# Patient Record
Sex: Male | Born: 1995
Health system: Southern US, Community
[De-identification: ages and names within clinical notes are randomized; demographics above are authoritative.]

## PROBLEM LIST (undated history)

## (undated) DIAGNOSIS — T7840XA Allergy, unspecified, initial encounter: Secondary | ICD-10-CM

## (undated) DIAGNOSIS — J302 Other seasonal allergic rhinitis: Secondary | ICD-10-CM

## (undated) DIAGNOSIS — L709 Acne, unspecified: Secondary | ICD-10-CM

## (undated) DIAGNOSIS — S060XAA Concussion with loss of consciousness status unknown, initial encounter: Secondary | ICD-10-CM

## (undated) DIAGNOSIS — S060X9A Concussion with loss of consciousness of unspecified duration, initial encounter: Secondary | ICD-10-CM

## (undated) HISTORY — DX: Acne, unspecified: L70.9

## (undated) HISTORY — DX: Allergy, unspecified, initial encounter: T78.40XA

---
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
## 2017-04-30 DIAGNOSIS — R0981 Nasal congestion: Secondary | ICD-10-CM | POA: Diagnosis not present

## 2017-05-24 DIAGNOSIS — Z23 Encounter for immunization: Secondary | ICD-10-CM | POA: Diagnosis not present

## 2017-07-20 MED FILL — PREVIDENT 5000 BOOSTER PLUS: 1.1 | 30 days supply | Qty: 100 | Fill #0

## 2017-10-07 DIAGNOSIS — Z0000 Encounter for general adult medical examination without abnormal findings: Secondary | ICD-10-CM | POA: Diagnosis not present

## 2018-02-16 MED FILL — PREVIDENT 5000 BOOSTER PLUS: 1.1 | 30 days supply | Qty: 100 | Fill #1

## 2018-10-28 ENCOUNTER — Emergency Department (HOSPITAL_COMMUNITY): Payer: 59

## 2018-10-28 ENCOUNTER — Inpatient Hospital Stay (HOSPITAL_COMMUNITY)
Admission: EM | Admit: 2018-10-28 | Discharge: 2018-11-03 | DRG: 084 | Disposition: A | Discharge status: Home / Self Care | Payer: 59 | Source: Ambulatory Visit | Attending: Neurosurgery | Admitting: Neurosurgery

## 2018-10-28 ENCOUNTER — Other Ambulatory Visit: Payer: Self-pay

## 2018-10-28 DIAGNOSIS — S02119A Unspecified fracture of occiput, initial encounter for closed fracture: Secondary | ICD-10-CM | POA: Diagnosis present

## 2018-10-28 DIAGNOSIS — I609 Nontraumatic subarachnoid hemorrhage, unspecified: Secondary | ICD-10-CM

## 2018-10-28 DIAGNOSIS — R001 Bradycardia, unspecified: Secondary | ICD-10-CM | POA: Diagnosis present

## 2018-10-28 DIAGNOSIS — F458 Other somatoform disorders: Secondary | ICD-10-CM | POA: Diagnosis present

## 2018-10-28 DIAGNOSIS — S0990XA Unspecified injury of head, initial encounter: Secondary | ICD-10-CM | POA: Diagnosis not present

## 2018-10-28 DIAGNOSIS — Z23 Encounter for immunization: Secondary | ICD-10-CM | POA: Diagnosis not present

## 2018-10-28 DIAGNOSIS — Z833 Family history of diabetes mellitus: Secondary | ICD-10-CM | POA: Diagnosis not present

## 2018-10-28 DIAGNOSIS — S0291XA Unspecified fracture of skull, initial encounter for closed fracture: Secondary | ICD-10-CM | POA: Diagnosis not present

## 2018-10-28 DIAGNOSIS — R531 Weakness: Secondary | ICD-10-CM | POA: Diagnosis not present

## 2018-10-28 DIAGNOSIS — R456 Violent behavior: Secondary | ICD-10-CM | POA: Diagnosis not present

## 2018-10-28 DIAGNOSIS — Z825 Family history of asthma and other chronic lower respiratory diseases: Secondary | ICD-10-CM

## 2018-10-28 DIAGNOSIS — R55 Syncope and collapse: Secondary | ICD-10-CM | POA: Diagnosis present

## 2018-10-28 DIAGNOSIS — Y92531 Health care provider office as the place of occurrence of the external cause: Secondary | ICD-10-CM

## 2018-10-28 DIAGNOSIS — R402363 Coma scale, best motor response, obeys commands, at hospital admission: Secondary | ICD-10-CM | POA: Diagnosis present

## 2018-10-28 DIAGNOSIS — I951 Orthostatic hypotension: Secondary | ICD-10-CM | POA: Diagnosis not present

## 2018-10-28 DIAGNOSIS — R402143 Coma scale, eyes open, spontaneous, at hospital admission: Secondary | ICD-10-CM | POA: Diagnosis present

## 2018-10-28 DIAGNOSIS — R4587 Impulsiveness: Secondary | ICD-10-CM | POA: Diagnosis present

## 2018-10-28 DIAGNOSIS — W19XXXA Unspecified fall, initial encounter: Secondary | ICD-10-CM | POA: Diagnosis present

## 2018-10-28 DIAGNOSIS — W228XXA Striking against or struck by other objects, initial encounter: Secondary | ICD-10-CM | POA: Diagnosis present

## 2018-10-28 DIAGNOSIS — S06339A Contusion and laceration of cerebrum, unspecified, with loss of consciousness of unspecified duration, initial encounter: Secondary | ICD-10-CM | POA: Diagnosis not present

## 2018-10-28 DIAGNOSIS — I629 Nontraumatic intracranial hemorrhage, unspecified: Secondary | ICD-10-CM | POA: Diagnosis not present

## 2018-10-28 DIAGNOSIS — Z79899 Other long term (current) drug therapy: Secondary | ICD-10-CM | POA: Diagnosis not present

## 2018-10-28 DIAGNOSIS — R402233 Coma scale, best verbal response, inappropriate words, at hospital admission: Secondary | ICD-10-CM | POA: Diagnosis present

## 2018-10-28 DIAGNOSIS — S06329A Contusion and laceration of left cerebrum with loss of consciousness of unspecified duration, initial encounter: Secondary | ICD-10-CM | POA: Diagnosis present

## 2018-10-28 DIAGNOSIS — R404 Transient alteration of awareness: Secondary | ICD-10-CM | POA: Diagnosis not present

## 2018-10-28 DIAGNOSIS — E860 Dehydration: Secondary | ICD-10-CM | POA: Diagnosis present

## 2018-10-28 DIAGNOSIS — D72829 Elevated white blood cell count, unspecified: Secondary | ICD-10-CM | POA: Diagnosis not present

## 2018-10-28 DIAGNOSIS — S06360A Traumatic hemorrhage of cerebrum, unspecified, without loss of consciousness, initial encounter: Secondary | ICD-10-CM | POA: Diagnosis not present

## 2018-10-28 LAB — BASIC METABOLIC PANEL
ANION GAP: 10 (ref 5–15)
BUN: 18 mg/dL (ref 6–20)
CALCIUM: 9.2 mg/dL (ref 8.9–10.3)
CO2: 24 mmol/L (ref 22–32)
Chloride: 103 mmol/L (ref 98–111)
Creatinine, Ser: 1.03 mg/dL (ref 0.61–1.24)
GFR calc Af Amer: >60 mL/min (ref >60)
GFR calc non Af Amer: >60 mL/min (ref >60)
GLUCOSE: 155 mg/dL — AB (ref 70–99)
Potassium: 4.2 mmol/L (ref 3.5–5.1)
Sodium: 137 mmol/L (ref 135–145)

## 2018-10-28 LAB — CBC WITH DIFFERENTIAL/PLATELET
Abs Immature Granulocytes: 0.07 10*3/uL (ref 0.00–0.07)
Basophils Absolute: 0.1 10*3/uL (ref 0.0–0.1)
Basophils Relative: 0 %
EOS ABS: 0.1 10*3/uL (ref 0.0–0.5)
EOS PCT: 0 %
HEMATOCRIT: 42.1 % (ref 39.0–52.0)
Hemoglobin: 14.4 g/dL (ref 13.0–17.0)
IMMATURE GRANULOCYTES: 0 %
LYMPHS ABS: 1.2 10*3/uL (ref 0.7–4.0)
Lymphocytes Relative: 7 %
MCH: 29 pg (ref 26.0–34.0)
MCHC: 34.2 g/dL (ref 30.0–36.0)
MCV: 84.9 fL (ref 80.0–100.0)
MONO ABS: 1.2 10*3/uL — AB (ref 0.1–1.0)
MONOS PCT: 7 %
Neutro Abs: 14.6 10*3/uL — ABNORMAL HIGH (ref 1.7–7.7)
Neutrophils Relative %: 86 %
Platelets: 251 10*3/uL (ref 150–400)
RBC: 4.96 MIL/uL (ref 4.22–5.81)
RDW: 11.9 % (ref 11.5–15.5)
WBC: 17.2 10*3/uL — ABNORMAL HIGH (ref 4.0–10.5)
nRBC: 0 % (ref 0.0–0.2)

## 2018-10-28 LAB — CBG MONITORING, ED: Glucose-Capillary: 128 mg/dL — ABNORMAL HIGH (ref 70–99)

## 2018-10-28 LAB — ETHANOL

## 2018-10-28 LAB — RAPID URINE DRUG SCREEN, HOSP PERFORMED
Amphetamines: NOT DETECTED
BARBITURATES: NOT DETECTED
BENZODIAZEPINES: NOT DETECTED
Cocaine: NOT DETECTED
Opiates: NOT DETECTED
Tetrahydrocannabinol: NOT DETECTED

## 2018-10-28 MED ORDER — SODIUM CHLORIDE 0.9 % IV SOLN
INTRAVENOUS | Status: DC | PRN
  Administered 2018-10-28: 250 mL via INTRAVENOUS

## 2018-10-28 MED ORDER — POTASSIUM CHLORIDE IN NACL 20-0.9 MEQ/L-% IV SOLN
INTRAVENOUS | Status: DC
  Administered 2018-10-28: 15:00:00 via INTRAVENOUS
  Filled 2018-10-28 (×2): qty 1000

## 2018-10-28 MED ORDER — ACETAMINOPHEN 325 MG PO TABS
650.0000 mg | ORAL_TABLET | ORAL | Status: DC | PRN
  Administered 2018-10-28 – 2018-10-29 (×2): 650 mg via ORAL
  Administered 2018-10-29: 325 mg via ORAL
  Administered 2018-10-29 – 2018-11-03 (×5): 650 mg via ORAL
  Filled 2018-10-28 (×8): qty 2

## 2018-10-28 MED ORDER — FAMOTIDINE IN NACL 20-0.9 MG/50ML-% IV SOLN
20.0000 mg | Freq: Two times a day (BID) | INTRAVENOUS | Status: DC
  Administered 2018-10-28 – 2018-10-30 (×5): 20 mg via INTRAVENOUS
  Filled 2018-10-28 (×5): qty 50

## 2018-10-28 MED ORDER — HYDROCODONE-ACETAMINOPHEN 5-325 MG PO TABS
1.0000 | ORAL_TABLET | ORAL | Status: DC | PRN
  Administered 2018-10-28 (×2): 1 via ORAL
  Administered 2018-10-29 (×2): 2 via ORAL
  Administered 2018-10-30 – 2018-10-31 (×4): 1 via ORAL
  Administered 2018-11-01: 2 via ORAL
  Administered 2018-11-01: 1 via ORAL
  Administered 2018-11-01 – 2018-11-02 (×4): 2 via ORAL
  Filled 2018-10-28 (×3): qty 2
  Filled 2018-10-28: qty 1
  Filled 2018-10-28: qty 2
  Filled 2018-10-28: qty 1
  Filled 2018-10-28 (×3): qty 2
  Filled 2018-10-28: qty 1
  Filled 2018-10-28: qty 2
  Filled 2018-10-28: qty 1
  Filled 2018-10-28: qty 2
  Filled 2018-10-28: qty 1
  Filled 2018-10-28: qty 2

## 2018-10-28 MED ORDER — ONDANSETRON HCL 4 MG/2ML IJ SOLN
4.0000 mg | Freq: Four times a day (QID) | INTRAMUSCULAR | Status: DC | PRN
  Administered 2018-10-28 – 2018-10-30 (×6): 4 mg via INTRAVENOUS
  Filled 2018-10-28 (×7): qty 2

## 2018-10-28 MED ORDER — ONDANSETRON HCL 4 MG/2ML IJ SOLN
4.0000 mg | Freq: Once | INTRAMUSCULAR | Status: AC
  Administered 2018-10-28: 4 mg via INTRAVENOUS

## 2018-10-28 NOTE — ED Notes (Signed)
Patient transported to CT 

## 2018-10-28 NOTE — H&P (Signed)
Subjective: The patient is a 23 year old white male who by report has had syncopal events with needles/blood, etc. in the past.  He has some injections and a primary doctor's office today and had a syncopal event falling and striking his head.  There was a brief loss of consciousness and mental status changes.  He was brought to [facility], ER where head scan was obtained.  This demonstrated a skull fracture and a cerebral contusion.  A neurosurgical consultation was requested.  Presently the patient is accompanied by his father provides his history.  There has been no reported seizures.  The patient denies neck pain.  He is not on any anticoagulants he is previously been healthy.  He is a Ship broker at [employer].  Past Medical History:  Diagnosis Date  . Acne   . Allergy    seasonal allergies  . Concussion   . Seasonal allergies     Past Surgical History:  Procedure Laterality Date  . NASAL SEPTOPLASTY W/ TURBINOPLASTY Bilateral 05/05/2016   Procedure: NASAL SEPTOPLASTY WITH TURBINATE REDUCTION;  Surgeon: Rozetta Nunnery, MD;  Location: [place];  Service: ENT;  Laterality: Bilateral;    No Known Allergies  Social History   Tobacco Use  . Smoking status: Never Smoker  . Smokeless tobacco: Never Used  Substance Use Topics  . Alcohol use: No    Family History  Problem Relation Age of Onset  . Diabetes Father   . COPD Paternal Grandmother    Prior to Admission medications   Medication Sig Start Date End Date Taking? Authorizing Provider  HYDROcodone-acetaminophen (NORCO/VICODIN) 5-325 MG tablet Take 1-2 tablets by mouth every 6 (six) hours as needed for moderate pain. 05/05/16   Rozetta Nunnery, MD     Review of Systems  Positive ROS: As above  All other systems have been reviewed and were otherwise negative with the exception of those mentioned in the HPI and as above.  Objective: Vital signs in last 24 hours: Pulse Rate:  [56-89] 81 (01/17  1245) Resp:  [0-22] 17 (01/17 1230) BP: (111-143)/(49-88) 126/84 (01/17 1245) SpO2:  [98 %-100 %] 100 % (01/17 1245) Estimated body mass index is 21.62 kg/m as calculated from the following:   Height as of 05/05/16: 5\' 11"  (1.803 m).   Weight as of 05/05/16: 70.3 kg.   Physical exam:  General: Mildly somnolent but easily arousable 23 year old white male who will answer simple questions.  HEENT: Patient has some occipital soft tissue swelling.  His pupils are equal.  Extractor muscles are largely intact although occasionally has a bit of disconjugate gaze.  I do not see any evidence of CSF otorrhea or rhinorrhea.  Neck: Supple without masses or deformities.  He has a normal range of motion.  Thorax: Symmetric  Abdomen: Soft  Extremities: Unremarkable  Neurologic exam: The patient is Glascow coma scale 13, E4M6V3.  He will answer simple questions.  He is oriented only to person.  He occasionally uses incorrect words.  The patient's motor strength is 5/5 in his bilateral bicep, handgrip, gastrocnemius, and dorsiflexors.  Cerebellar function is intact to rapid alternating movements of the upper extremities bilaterally.  Sensory function is intact to light touch sensation all tested dermatomes bilaterally.  Imaging studies: I have reviewed the patient's head CT performed today at [facility].  He has a midline occipital skull fracture.  He has a left frontal small cerebral contusion without significant mass-effect.  I have also reviewed his cervical CT.  It is unremarkable.   Data Review Lab Results  Component Value Date   WBC 17.2 (H) 10/28/2018   HGB 14.4 10/28/2018   HCT 42.1 10/28/2018   MCV 84.9 10/28/2018   PLT 251 10/28/2018   No results found for: NA, K, CL, CO2, BUN, CREATININE, GLUCOSE No results found for: INR, PROTIME  Assessment/Plan: Skull fracture, cerebral contusion, traumatic brain injury: I have discussed the situation with the patient's father.  I  recommend we admit him for observation and plan to repeat his CAT scan tomorrow to rule out additional bleeding.  He is agreeable.  I have answered all his questions.   Ophelia Charter 10/28/2018 1:27 PM

## 2018-10-28 NOTE — ED Notes (Signed)
Neuro surgery at bedside.

## 2018-10-28 NOTE — ED Notes (Signed)
Aspen collar placed.

## 2018-10-28 NOTE — ED Triage Notes (Addendum)
Pt got 2 vaccines at [employer]. Syncopized at desk. Hit chin on counter then head on floor. Was combative initially and vomiting and concussed. Repetitive questioning. Hx of syncopizing when had laceration. Pt is pale currently but alert to self and that surroundings but not time

## 2018-10-28 NOTE — ED Provider Notes (Signed)
[place] EMERGENCY DEPARTMENT Provider Note   CSN: [id number] Arrival date & time: 10/28/18  7948     History   Chief Complaint Chief Complaint  Patient presents with  . Loss of Consciousness    HPI Patrick Mclaughlin is a 23 y.o. male.  HPI  The patient is a 23 year old male who presents from the doctor's office after receiving vaccinations.  He has a known history of recurrent syncope after painful episodes or after seeing blood in the past.  Evidently today he had had vaccinations, went out to check out at the desk where he syncopized striking his chin on the counter and falling backwards striking his head.  He was reportedly combative and ill at the scene but has slowly improved.  He is asking repetitive questions and seems to be altered.  His mother is here reporting the additional history.  Level 5 caveat applies secondary to the patient's altered mental status.  Past Medical History:  Diagnosis Date  . Acne   . Allergy    seasonal allergies  . Concussion   . Seasonal allergies     There are no active problems to display for this patient.   Past Surgical History:  Procedure Laterality Date  . NASAL SEPTOPLASTY W/ TURBINOPLASTY Bilateral 05/05/2016   Procedure: NASAL SEPTOPLASTY WITH TURBINATE REDUCTION;  Surgeon: Rozetta Nunnery, MD;  Location: [place];  Service: ENT;  Laterality: Bilateral;        Home Medications    Prior to Admission medications   Medication Sig Start Date End Date Taking? Authorizing Provider  HYDROcodone-acetaminophen (NORCO/VICODIN) 5-325 MG tablet Take 1-2 tablets by mouth every 6 (six) hours as needed for moderate pain. 05/05/16   Rozetta Nunnery, MD    Family History Family History  Problem Relation Age of Onset  . Diabetes Father   . COPD Paternal Grandmother     Social History Social History   Tobacco Use  . Smoking status: Never Smoker  . Smokeless tobacco: Never Used    Substance Use Topics  . Alcohol use: No  . Drug use: No     Allergies   Patient has no known allergies.   Review of Systems Review of Systems  Unable to perform ROS: Mental status change     Physical Exam Updated Vital Signs BP 126/84   Pulse 81   Resp 17   SpO2 100%   Physical Exam Vitals signs and nursing note reviewed.  Constitutional:      General: He is not in acute distress.    Appearance: He is well-developed.     Comments: Somnolent, confused  HENT:     Head: Normocephalic.     Comments: Small hematoma to the back of the head, no laceration, lower lip on the right with small internal laceration but no through and through injury, dentition intact without tenderness or subluxation.  No malocclusion    Mouth/Throat:     Pharynx: No oropharyngeal exudate.  Eyes:     General: No scleral icterus.       Right eye: No discharge.        Left eye: No discharge.     Conjunctiva/sclera: Conjunctivae normal.     Pupils: Pupils are equal, round, and reactive to light.  Neck:     Thyroid: No thyromegaly.     Vascular: No JVD.     Comments: Maintain cervical immobilization Cardiovascular:     Rate and Rhythm: Normal rate and regular  rhythm.     Heart sounds: Normal heart sounds. No murmur. No friction rub. No gallop.   Pulmonary:     Effort: Pulmonary effort is normal. No respiratory distress.     Breath sounds: Normal breath sounds. No wheezing or rales.  Abdominal:     General: Bowel sounds are normal. There is no distension.     Palpations: Abdomen is soft. There is no mass.     Tenderness: There is no abdominal tenderness.  Musculoskeletal: Normal range of motion.        General: No tenderness.  Lymphadenopathy:     Cervical: No cervical adenopathy.  Skin:    General: Skin is warm and dry.     Findings: No erythema or rash.  Neurological:     Coordination: Coordination normal.     Comments: The patient is able to follow commands though he is somnolent,  somewhat confused, unable to answer my questions appropriately.,  No obvious facial droop, no disconjugate gaze, normal pupillary exam  Psychiatric:        Behavior: Behavior normal.      ED Treatments / Results  Labs (all labs ordered are listed, but only abnormal results are displayed) Labs Reviewed  CBC WITH DIFFERENTIAL/PLATELET - Abnormal; Notable for the following components:      Result Value   WBC 17.2 (*)    Neutro Abs 14.6 (*)    Monocytes Absolute 1.2 (*)    All other components within normal limits  CBG MONITORING, ED - Abnormal; Notable for the following components:   Glucose-Capillary 128 (*)    All other components within normal limits  BASIC METABOLIC PANEL  RAPID URINE DRUG SCREEN, HOSP PERFORMED  ETHANOL    EKG EKG Interpretation  Date/Time:  Friday October 28 2018 09:58:18 EST Ventricular Rate:  87 PR Interval:    QRS Duration: 96 QT Interval:  384 QTC Calculation: 462 R Axis:   78 Text Interpretation:  Sinus rhythm RSR' in V1 or V2, probably normal variant No old tracing to compare Confirmed by Noemi Chapel [phone]) on 10/28/2018 10:13:21 AM   Radiology Ct Head Wo Contrast  Result Date: 10/28/2018 CLINICAL DATA:  23 year old male with history of head trauma presenting with headache. EXAM: CT HEAD WITHOUT CONTRAST CT CERVICAL SPINE WITHOUT CONTRAST TECHNIQUE: Multidetector CT imaging of the head and cervical spine was performed following the standard protocol without intravenous contrast. Multiplanar CT image reconstructions of the cervical spine were also generated. COMPARISON:  No priors. FINDINGS: CT HEAD FINDINGS Brain: In the left frontal lobe best appreciated on axial image 15 of series 4 and sagittal image 43 of series 16 there is a small amount of high attenuation material. Some of this extends into the sulci and overlying the convexity, indicative of subarachnoid hemorrhage. There is a more nodular focus measuring 6 x 9 x 8 mm, suspicious for focal  cortical hemorrhagic contusion. No evidence of hydrocephalus or mass lesion/mass effect. Vascular: No hyperdense vessel or unexpected calcification. Skull: Sagittally oriented midline fracture through the occipital bone extending to the level of the foramen magnum. Sinuses/Orbits: Left-sided turbinates have been surgically resected. No acute finding. Other: None. CT CERVICAL SPINE FINDINGS Alignment: Normal. Skull base and vertebrae: Sagittally oriented midline fracture through the occipital bone extending to the level of the foramen magnum. No acute displaced fracture of the cervical spine. Soft tissues and spinal canal: No prevertebral fluid or swelling. No visible canal hematoma. Disc levels: No significant degenerative disc disease or facet arthropathy. Upper  chest: Negative. Other: 1.3 x 1.0 cm partially calcified nodule in the left lobe of the thyroid gland. IMPRESSION: 1. Sagittally oriented midline fracture through the occipital bone extending to the level of the foramen magnum. 2. Small amount of left frontal hemorrhage. This appears to be both a very small left frontal lobe cortical hemorrhagic contusion, and some adjacent subarachnoid hemorrhage, as detailed above. 3. No evidence of significant acute traumatic injury to the cervical spine. 4. 1.3 x 1.0 cm partially calcified nodule in the left lobe of the thyroid gland. Further evaluation with nonemergent thyroid ultrasound is recommended in the near future to better evaluate this finding and determine if there is a need to further evaluate this lesion with fine-needle aspiration. This recommendation follows ACR consensus guidelines: Managing Incidental Thyroid Nodules Detected on Imaging: White Paper of the ACR Incidental Thyroid Findings Committee. J Am Coll Radiol 2015;12(2):143-150. Critical Value/emergent results were called by telephone at the time of interpretation on 10/28/2018 at 12:10 pm to Dr. Noemi Chapel, who verbally acknowledged these  results. Electronically Signed   By: Vinnie Langton M.D.   On: 10/28/2018 12:12   Ct Cervical Spine Wo Contrast  Result Date: 10/28/2018 CLINICAL DATA:  23 year old male with history of head trauma presenting with headache. EXAM: CT HEAD WITHOUT CONTRAST CT CERVICAL SPINE WITHOUT CONTRAST TECHNIQUE: Multidetector CT imaging of the head and cervical spine was performed following the standard protocol without intravenous contrast. Multiplanar CT image reconstructions of the cervical spine were also generated. COMPARISON:  No priors. FINDINGS: CT HEAD FINDINGS Brain: In the left frontal lobe best appreciated on axial image 15 of series 4 and sagittal image 43 of series 16 there is a small amount of high attenuation material. Some of this extends into the sulci and overlying the convexity, indicative of subarachnoid hemorrhage. There is a more nodular focus measuring 6 x 9 x 8 mm, suspicious for focal cortical hemorrhagic contusion. No evidence of hydrocephalus or mass lesion/mass effect. Vascular: No hyperdense vessel or unexpected calcification. Skull: Sagittally oriented midline fracture through the occipital bone extending to the level of the foramen magnum. Sinuses/Orbits: Left-sided turbinates have been surgically resected. No acute finding. Other: None. CT CERVICAL SPINE FINDINGS Alignment: Normal. Skull base and vertebrae: Sagittally oriented midline fracture through the occipital bone extending to the level of the foramen magnum. No acute displaced fracture of the cervical spine. Soft tissues and spinal canal: No prevertebral fluid or swelling. No visible canal hematoma. Disc levels: No significant degenerative disc disease or facet arthropathy. Upper chest: Negative. Other: 1.3 x 1.0 cm partially calcified nodule in the left lobe of the thyroid gland. IMPRESSION: 1. Sagittally oriented midline fracture through the occipital bone extending to the level of the foramen magnum. 2. Small amount of left  frontal hemorrhage. This appears to be both a very small left frontal lobe cortical hemorrhagic contusion, and some adjacent subarachnoid hemorrhage, as detailed above. 3. No evidence of significant acute traumatic injury to the cervical spine. 4. 1.3 x 1.0 cm partially calcified nodule in the left lobe of the thyroid gland. Further evaluation with nonemergent thyroid ultrasound is recommended in the near future to better evaluate this finding and determine if there is a need to further evaluate this lesion with fine-needle aspiration. This recommendation follows ACR consensus guidelines: Managing Incidental Thyroid Nodules Detected on Imaging: White Paper of the ACR Incidental Thyroid Findings Committee. J Am Coll Radiol 2015;12(2):143-150. Critical Value/emergent results were called by telephone at the time of interpretation on 10/28/2018  at 12:10 pm to Dr. Noemi Chapel, who verbally acknowledged these results. Electronically Signed   By: Vinnie Langton M.D.   On: 10/28/2018 12:12    Procedures .Critical Care Performed by: Noemi Chapel, MD Authorized by: Noemi Chapel, MD   Critical care provider statement:    Critical care time (minutes):  35   Critical care time was exclusive of:  Separately billable procedures and treating other patients and teaching time   Critical care was necessary to treat or prevent imminent or life-threatening deterioration of the following conditions:  CNS failure or compromise   Critical care was time spent personally by me on the following activities:  Blood draw for specimens, development of treatment plan with patient or surrogate, discussions with consultants, evaluation of patient's response to treatment, examination of patient, obtaining history from patient or surrogate, ordering and performing treatments and interventions, ordering and review of laboratory studies, ordering and review of radiographic studies, pulse oximetry, re-evaluation of patient's condition and  review of old charts   (including critical care time)  Medications Ordered in ED Medications  ondansetron (ZOFRAN) injection 4 mg (4 mg Intravenous Given 10/28/18 1050)     Initial Impression / Assessment and Plan / ED Course  I have reviewed the triage vital signs and the nursing notes.  Pertinent labs & imaging results that were available during my care of the patient were reviewed by me and considered in my medical decision making (see chart for details).     The patient is confused, likely concussed but will need to rule out intracranial injury.  He has had prior concussions in the past, mother is at the bedside and noting his abnormal mental status.  This is likely related to a vagal event secondary to painful stimuli, vaccination as this is what it is been linked to in the past.  His EKG is unremarkable showing no arrhythmias or signs of Wolff-Parkinson-White  I have personally looked at the CT scan and there does appear to be a large occipital fracture as well as a contrecoup injury to the anterior frontal lobe of the brain with an intracerebral hematoma and some subarachnoid hemorrhage.  I discussed the case with neurosurgery Dr. Arnoldo Morale who will come to admit.  Final Clinical Impressions(s) / ED Diagnoses   Final diagnoses:  SAH (subarachnoid hemorrhage) ([place])  Closed fracture of occipital bone, unspecified laterality, unspecified occipital fracture type, initial encounter [facility])    ED Discharge Orders    None       Noemi Chapel, MD 10/28/18 1305

## 2018-10-28 NOTE — ED Notes (Signed)
CT called to stat pt

## 2018-10-29 ENCOUNTER — Inpatient Hospital Stay (HOSPITAL_COMMUNITY): Payer: 59

## 2018-10-29 LAB — HIV ANTIBODY (ROUTINE TESTING W REFLEX): HIV Screen 4th Generation wRfx: NONREACTIVE

## 2018-10-29 NOTE — Progress Notes (Signed)
Pt had 3 pauses in heart rhythm since he came over from ICU at 1pm all pauses were under 3sec. Notified on call MD. Will cont to monitor. Pt is asymptomatic and VS are stable.

## 2018-10-29 NOTE — Progress Notes (Signed)
Subjective: The patient is alert and pleasant.  He is less confused.  His mother is at the bedside.  Objective: Vital signs in last 24 hours: Temp:  [98 F (36.7 C)-99.5 F (37.5 C)] 98.4 F (36.9 C) (01/18 0000) Pulse Rate:  [56-100] 59 (01/18 0700) Resp:  [0-32] 18 (01/18 0700) BP: (108-148)/(49-94) 114/55 (01/18 0700) SpO2:  [97 %-100 %] 98 % (01/18 0700) Weight:  [68.4 kg] 68.4 kg (01/18 0441) Estimated body mass index is 21.62 kg/m as calculated from the following:   Height as of 05/05/16: 5\' 11"  (1.803 m).   Weight as of 05/05/16: 70.3 kg.   Intake/Output from previous day: 01/17 0701 - 01/18 0700 In: 222.3 [P.O.:50; I.V.:72.1; IV Piggyback:100.1] Out: 1055 [Urine:1055] Intake/Output this shift: No intake/output data recorded.  Physical exam Glascow coma scale 14.  He follows commands and moves all 4 extremities.  His pupils are equal.  He knows he is in a hospital and January.  I have reviewed the patient's follow-up head CT performed today.  It demonstrates no significant additional bleeding.  He has left cerebral contusions without significant mass-effect.  Lab Results: Recent Labs    10/28/18 1213  WBC 17.2*  HGB 14.4  HCT 42.1  PLT 251   BMET Recent Labs    10/28/18 1213  NA 137  K 4.2  CL 103  CO2 24  GLUCOSE 155*  BUN 18  CREATININE 1.03  CALCIUM 9.2    Studies/Results: Ct Head Wo Contrast  Result Date: 10/29/2018 CLINICAL DATA:  Intracranial hemorrhage follow up EXAM: CT HEAD WITHOUT CONTRAST TECHNIQUE: Contiguous axial images were obtained from the base of the skull through the vertex without intravenous contrast. COMPARISON:  10/28/2018 FINDINGS: Brain: Small left frontal and anterior temporal lobe hemorrhagic contusions. The temporal contusion has very slightly increased in size (measuring approximately 6 mm), but the frontal contusion is unchanged. Unchanged appearance of small volume subarachnoid blood. No hydrocephalus. No midline shift or  other mass effect. No new site of hemorrhage. Vascular: No abnormal hyperdensity of the major intracranial arteries or dural venous sinuses. No intracranial atherosclerosis. Skull: Unchanged appearance of sagittally oriented fracture of the occipital bone. Sinuses/Orbits: No fluid levels or advanced mucosal thickening of the visualized paranasal sinuses. No mastoid or middle ear effusion. The orbits are normal. IMPRESSION: 1. Minimally increased size anterior left temporal lobe hemorrhagic contusion with nearby subarachnoid blood 2. Unchanged small left frontal lobe hemorrhagic contusion with associated subarachnoid blood. 3. Unchanged appearance of occipital bone fracture. Electronically Signed   By: Ulyses Jarred M.D.   On: 10/29/2018 05:39   Ct Head Wo Contrast  Result Date: 10/28/2018 CLINICAL DATA:  23 year old male with history of head trauma presenting with headache. EXAM: CT HEAD WITHOUT CONTRAST CT CERVICAL SPINE WITHOUT CONTRAST TECHNIQUE: Multidetector CT imaging of the head and cervical spine was performed following the standard protocol without intravenous contrast. Multiplanar CT image reconstructions of the cervical spine were also generated. COMPARISON:  No priors. FINDINGS: CT HEAD FINDINGS Brain: In the left frontal lobe best appreciated on axial image 15 of series 4 and sagittal image 43 of series 16 there is a small amount of high attenuation material. Some of this extends into the sulci and overlying the convexity, indicative of subarachnoid hemorrhage. There is a more nodular focus measuring 6 x 9 x 8 mm, suspicious for focal cortical hemorrhagic contusion. No evidence of hydrocephalus or mass lesion/mass effect. Vascular: No hyperdense vessel or unexpected calcification. Skull: Sagittally oriented midline fracture through  the occipital bone extending to the level of the foramen magnum. Sinuses/Orbits: Left-sided turbinates have been surgically resected. No acute finding. Other: None. CT  CERVICAL SPINE FINDINGS Alignment: Normal. Skull base and vertebrae: Sagittally oriented midline fracture through the occipital bone extending to the level of the foramen magnum. No acute displaced fracture of the cervical spine. Soft tissues and spinal canal: No prevertebral fluid or swelling. No visible canal hematoma. Disc levels: No significant degenerative disc disease or facet arthropathy. Upper chest: Negative. Other: 1.3 x 1.0 cm partially calcified nodule in the left lobe of the thyroid gland. IMPRESSION: 1. Sagittally oriented midline fracture through the occipital bone extending to the level of the foramen magnum. 2. Small amount of left frontal hemorrhage. This appears to be both a very small left frontal lobe cortical hemorrhagic contusion, and some adjacent subarachnoid hemorrhage, as detailed above. 3. No evidence of significant acute traumatic injury to the cervical spine. 4. 1.3 x 1.0 cm partially calcified nodule in the left lobe of the thyroid gland. Further evaluation with nonemergent thyroid ultrasound is recommended in the near future to better evaluate this finding and determine if there is a need to further evaluate this lesion with fine-needle aspiration. This recommendation follows ACR consensus guidelines: Managing Incidental Thyroid Nodules Detected on Imaging: White Paper of the ACR Incidental Thyroid Findings Committee. J Am Coll Radiol 2015;12(2):143-150. Critical Value/emergent results were called by telephone at the time of interpretation on 10/28/2018 at 12:10 pm to Dr. Noemi Chapel, who verbally acknowledged these results. Electronically Signed   By: Vinnie Langton M.D.   On: 10/28/2018 12:12   Ct Cervical Spine Wo Contrast  Result Date: 10/28/2018 CLINICAL DATA:  23 year old male with history of head trauma presenting with headache. EXAM: CT HEAD WITHOUT CONTRAST CT CERVICAL SPINE WITHOUT CONTRAST TECHNIQUE: Multidetector CT imaging of the head and cervical spine was  performed following the standard protocol without intravenous contrast. Multiplanar CT image reconstructions of the cervical spine were also generated. COMPARISON:  No priors. FINDINGS: CT HEAD FINDINGS Brain: In the left frontal lobe best appreciated on axial image 15 of series 4 and sagittal image 43 of series 16 there is a small amount of high attenuation material. Some of this extends into the sulci and overlying the convexity, indicative of subarachnoid hemorrhage. There is a more nodular focus measuring 6 x 9 x 8 mm, suspicious for focal cortical hemorrhagic contusion. No evidence of hydrocephalus or mass lesion/mass effect. Vascular: No hyperdense vessel or unexpected calcification. Skull: Sagittally oriented midline fracture through the occipital bone extending to the level of the foramen magnum. Sinuses/Orbits: Left-sided turbinates have been surgically resected. No acute finding. Other: None. CT CERVICAL SPINE FINDINGS Alignment: Normal. Skull base and vertebrae: Sagittally oriented midline fracture through the occipital bone extending to the level of the foramen magnum. No acute displaced fracture of the cervical spine. Soft tissues and spinal canal: No prevertebral fluid or swelling. No visible canal hematoma. Disc levels: No significant degenerative disc disease or facet arthropathy. Upper chest: Negative. Other: 1.3 x 1.0 cm partially calcified nodule in the left lobe of the thyroid gland. IMPRESSION: 1. Sagittally oriented midline fracture through the occipital bone extending to the level of the foramen magnum. 2. Small amount of left frontal hemorrhage. This appears to be both a very small left frontal lobe cortical hemorrhagic contusion, and some adjacent subarachnoid hemorrhage, as detailed above. 3. No evidence of significant acute traumatic injury to the cervical spine. 4. 1.3 x 1.0 cm partially calcified  nodule in the left lobe of the thyroid gland. Further evaluation with nonemergent thyroid  ultrasound is recommended in the near future to better evaluate this finding and determine if there is a need to further evaluate this lesion with fine-needle aspiration. This recommendation follows ACR consensus guidelines: Managing Incidental Thyroid Nodules Detected on Imaging: White Paper of the ACR Incidental Thyroid Findings Committee. J Am Coll Radiol 2015;12(2):143-150. Critical Value/emergent results were called by telephone at the time of interpretation on 10/28/2018 at 12:10 pm to Dr. Noemi Chapel, who verbally acknowledged these results. Electronically Signed   By: Vinnie Langton M.D.   On: 10/28/2018 12:12    Assessment/Plan: Traumatic brain injury, skull fracture, cerebral contusions: The patient is improving clinically.  His follow-up head CT is stable.  He is okay to move to the progressive unit.  We will mobilize him with PT and OT.  LOS: 1 day     Ophelia Charter 10/29/2018, 8:26 AM

## 2018-10-30 DIAGNOSIS — R55 Syncope and collapse: Secondary | ICD-10-CM

## 2018-10-30 MED ORDER — FAMOTIDINE 20 MG PO TABS
20.0000 mg | ORAL_TABLET | Freq: Two times a day (BID) | ORAL | Status: DC
  Administered 2018-10-30 – 2018-11-03 (×7): 20 mg via ORAL
  Filled 2018-10-30 (×8): qty 1

## 2018-10-30 NOTE — Evaluation (Signed)
Physical Therapy Evaluation Patient Details Name: Patrick Mclaughlin MRN: [id number] DOB: 02/23/1996 Today's Date: 10/30/2018   History of Present Illness  The patient is a 23 year old white male who by report has had syncopal events with needles/blood, etc. in the past.  He has some injections and a primary doctor's office today and had a syncopal event falling and striking his head.  There was a brief loss of consciousness and mental status changes.  He was brought to [facility], ER where head scan was obtained.  This demonstrated a skull fracture and a cerebral contusion;  has a past medical history of Acne, Allergy, Concussion, and Seasonal allergies.  Clinical Impression  Pt admitted with above diagnosis. Pt currently with functional limitations due to the deficits listed below (see PT Problem List). Independent prior to admission; Presents with decr functional independence, decr cognition, decr short-term memory; At this time, he has decr activity tolerance, and mild photophobia as well; Father present during session, we discussed TBI recovery course, what to expect, how to promote brain healing, brain rest; Performed as PT/OT co-eval, given his decr activity tolerance, see also OT note for more details related to cognition;  Pt will benefit from skilled PT to increase their independence and safety with mobility to allow discharge to the venue listed below.       Follow Up Recommendations Outpatient PT    Equipment Recommendations  None recommended by PT    Recommendations for Other Services       Precautions / Restrictions Precautions Precautions: Other (comment) Precaution Comments: TBI Restrictions Weight Bearing Restrictions: No      Mobility  Bed Mobility Overal bed mobility: Needs Assistance Bed Mobility: Supine to Sit     Supine to sit: Supervision     General bed mobility comments: Supervision for safety and line management  Transfers Overall transfer level: Needs  assistance Equipment used: None Transfers: Sit to/from Stand Sit to Stand: Min guard         General transfer comment: Minguard for safety; cues to self-monitor fo ractivity tolerance  Ambulation/Gait Ambulation/Gait assistance: Min guard Gait Distance (Feet): 550 Feet Assistive device: None Gait Pattern/deviations: Step-through pattern Gait velocity: approaching WNL   General Gait Details: Walked entire 4N floor without assistive device, with no gross loss of balance; presented with problem-solving tasks during walk; correctly answered math problem quickly, more difficulty with verbal oriented problem; difficulty multi-tasking; min cues with pathfinding  Stairs            Wheelchair Mobility    Modified Rankin (Stroke Patients Only)       Balance Overall balance assessment: Mild deficits observed, not formally tested                                           Pertinent Vitals/Pain Pain Assessment: 0-10 Pain Score: 2  Pain Location: HA Pain Descriptors / Indicators: Constant;Headache Pain Intervention(s): Monitored during session    Home Living Family/patient expects to be discharged to:: Private residence Living Arrangements: Parent;Non-relatives/Friends(lives with parents when not at school) Available Help at Discharge: Family;Available 24 hours/day Type of Home: House Home Access: Stairs to enter Entrance Stairs-Rails: Right Entrance Stairs-Number of Steps: 4 Home Layout: Two level;Bed/bath upstairs Home Equipment: None Additional Comments: Loft bed - bunk over desk    Prior Function Level of Independence: Independent         Comments:  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    Extremity/Trunk Assessment   Upper Extremity Assessment Upper Extremity Assessment: Defer to OT evaluation    Lower Extremity Assessment Lower Extremity Assessment: Overall WFL  for tasks assessed    Cervical / Trunk Assessment Cervical / Trunk Assessment: Normal  Communication   Communication: No difficulties  Cognition Arousal/Alertness: Awake/alert Behavior During Therapy: WFL for tasks assessed/performed Overall Cognitive Status: Impaired/Different from baseline Area of Impairment: Orientation;Memory;Awareness;Problem solving                 Orientation Level: Disoriented to;Place   Memory: Decreased short-term memory     Awareness: Emergent   General Comments: Difficulty with multi-tasking      General Comments General comments (skin integrity, edema, etc.): monitored vitals throughout; initial BP on the low side, BPs increased appropriately with activity; HR ranged 74-149 bpm throughout session    Exercises     Assessment/Plan    PT Assessment Patient needs continued PT services  PT Problem List Decreased activity tolerance;Decreased cognition;Decreased safety awareness;Decreased knowledge of precautions       PT Treatment Interventions Gait training;Stair training;Functional mobility training;Therapeutic activities;Therapeutic exercise;Balance training;Neuromuscular re-education;Cognitive remediation;Patient/family education    PT Goals (Current goals can be found in the Care Plan section)  Acute Rehab PT Goals Patient Stated Goal: 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 [place] PT Goal Formulation: With patient Time For Goal Achievement: 11/13/18 Potential to Achieve Goals: Good    Frequency Min 4X/week(Likely he will meet PT goals in 1-2 more session)   Barriers to discharge        Co-evaluation               AM-PAC PT "6 Clicks" Mobility  Outcome Measure Help needed turning from your back to your side while in a flat bed without using bedrails?: None Help needed moving from lying on your back to sitting on the side of a flat bed without using bedrails?: None Help needed moving to and from a bed to a chair  (including a wheelchair)?: A Little Help needed standing up from a chair using your arms (e.g., wheelchair or bedside chair)?: A Little Help needed to walk in hospital room?: None Help needed climbing 3-5 steps with a railing? : A Little 6 Click Score: 21    End of Session   Activity Tolerance: Patient tolerated treatment well Patient left: in bed;with call bell/phone within reach;with family/visitor present Nurse Communication: Mobility status PT Visit Diagnosis: Other abnormalities of gait and mobility (R26.89);Other symptoms and signs involving the nervous system (R29.898)    Time: 2130-8657 PT Time Calculation (min) (ACUTE ONLY): 34 min   Charges:   PT Evaluation $PT Eval Low Complexity: [place], PT  Acute Rehabilitation Services Pager [phone] Office [place] 10/30/2018, 10:10 AM

## 2018-10-30 NOTE — Evaluation (Addendum)
Occupational Therapy Evaluation Patient Details Name: Patrick Mclaughlin MRN: [id number] DOB: [birth year] Today's Date: 10/30/2018    History of Present Illness The patient is a 23 year old white male who by report has had syncopal events with needles/blood, etc. in the past.  He has some injections and a primary doctor's office today and had a syncopal event falling and striking his head.  There was a brief loss of consciousness and mental status changes.  He was brought to [facility] ER where head scan was obtained.  This demonstrated a skull fracture and a cerebral contusion;  has a past medical history of Acne, Allergy, Concussion, and Seasonal allergies.   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 Pt currently performs ADLs and functional mobility at [place] level. Pt presenting with decreased cognition as seen by poor ST memory, awareness, and problem solving. Pt able to verablize that he had difficulty with memory. Discussing TBI symptoms with Pt and father including decreasing screen time, decreasing sensory input, allowing for brain rest, and maintaining night-time/day-time rhythm. Pt would benefit from further acute OT to facilitate safe dc. Recommend dc to home with follow up at Neuro OP for further OT to optimize safety, independence with ADLs/IADLs, and return to PLOF.      Follow Up Recommendations  Outpatient OT;Supervision/Assistance - 24 hour(Neuro OP OT)    Equipment Recommendations  None recommended by OT    Recommendations for Other Services       Precautions / Restrictions Precautions Precautions: Other (comment) Precaution Comments: TBI Restrictions Weight Bearing Restrictions: No      Mobility Bed Mobility Overal bed mobility: Needs Assistance Bed Mobility: Supine to Sit     Supine to sit: Supervision     General bed mobility comments: Supervision for safety and line  management  Transfers Overall transfer level: Needs assistance Equipment used: None Transfers: Sit to/from Stand Sit to Stand: Min guard         General transfer comment: Minguard for safety; cues to self-monitor fo ractivity tolerance    Balance Overall balance assessment: Mild deficits observed, not formally tested                                         ADL either performed or assessed with clinical judgement   ADL Overall ADL's : Needs assistance/impaired Eating/Feeding: Independent;Sitting   Grooming: Supervision/safety;Oral care;Standing   Upper Body Bathing: Supervision/ safety;Standing   Lower Body Bathing: Supervison/ safety;Sit to/from stand   Upper Body Dressing : Supervision/safety;Sitting   Lower Body Dressing: Sit to/from stand;Min guard Lower Body Dressing Details (indicate cue type and reason): donning his pants Toilet Transfer: Supervision/safety;Ambulation(simulated to recliner)           Functional mobility during ADLs: Min guard;Supervision/safety General ADL Comments: Pt fatigues and reports a HA. Pt performing ADLs at supervision-Min Guard A level. Cognition being his main deficits.      Vision Baseline Vision/History: Wears glasses Wears Glasses: At all times Patient Visual Report: No change from baseline(Denies blurry or double vision)       Perception     Praxis      Pertinent Vitals/Pain Pain Assessment: 0-10 Pain Score: 2  Pain Location: HA Pain Descriptors / Indicators: Constant;Headache Pain Intervention(s): Monitored during session;Repositioned     Hand Dominance Right   Extremity/Trunk Assessment Upper  Extremity Assessment Upper Extremity Assessment: Overall WFL for tasks assessed   Lower Extremity Assessment Lower Extremity Assessment: Defer to PT evaluation   Cervical / Trunk Assessment Cervical / Trunk Assessment: Normal   Communication Communication Communication: No difficulties    Cognition Arousal/Alertness: Awake/alert Behavior During Therapy: WFL for tasks assessed/performed Overall Cognitive Status: Impaired/Different from baseline Area of Impairment: Orientation;Memory;Awareness;Problem solving;Safety/judgement                 Orientation Level: Disoriented to;Place   Memory: Decreased short-term memory   Safety/Judgement: Decreased awareness of deficits Awareness: Emergent Problem Solving: Slow processing;Requires tactile cues General Comments: Pt presenting with decreased orientation to place, poor ST memory, and decreased problem solving. Pt unable to recall room number during trail making task as well as 3 ST memory words. Pt naming 1/3 animals that start with the letter "L" and then began naming cities. Discussing TBI symtpoms and pt presenting with increaing awareness and able to state that he had difficulty with memory and finding his room. Pt performing simple money management question without difficulty.    General Comments  HR ranged 74-149 bpm throughout session. BP 104/69 standing, 126/73 sitting after oral care , and 120/57 supine after mobility in hall. Discussed with pt and father TBI symptoms, decreasing screen time, and maintaining night time day time rhythm.     Exercises     Shoulder Instructions      Home Living Family/patient expects to be discharged to:: Private residence Living Arrangements: Parent;Non-relatives/Friends(lives with parents when not at school) Available Help at Discharge: Family;Available 24 hours/day Type of Home: House Home Access: Stairs to enter [employer] of Steps: 4 Entrance Stairs-Rails: Right Home Layout: Two level;Bed/bath upstairs Alternate Level Stairs-Number of Steps: 8 Alternate Level Stairs-Rails: Left Bathroom Shower/Tub: Teacher, early years/pre: Standard     Home Equipment: None   Additional Comments: Loft bed - bunk over desk      Prior Functioning/Environment  Level of Independence: Independent        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[place]        [place] Problem List: Decreased activity tolerance;Impaired balance (sitting and/or standing);Decreased cognition;Decreased knowledge of use of DME or AE;Decreased knowledge of precautions;Pain      OT Treatment/Interventions: Self-care/ADL training;Therapeutic exercise;Energy conservation;DME and/or AE instruction;Therapeutic activities;Patient/family education    OT Goals(Current goals can be found in the care plan section) Acute Rehab OT Goals Patient Stated Goal: "Get better so I can go to my internship" OT Goal Formulation: With patient Time For Goal Achievement: 11/13/18 Potential to Achieve Goals: Good  OT Frequency: Min 4X/week   Barriers to D/C:            Co-evaluation PT/OT/SLP Co-Evaluation/Treatment: Yes Reason for Co-Treatment: Necessary to address cognition/behavior during functional activity;To address functional/ADL transfers   OT goals addressed during session: ADL's and self-care      AM-PAC OT "6 Clicks" Daily Activity     Outcome Measure Help from another person eating meals?: None Help from another person taking care of personal grooming?: None Help from another person toileting, which includes using toliet, bedpan, or urinal?: None Help from another person bathing (including washing, rinsing, drying)?: None Help from another person to put on and taking off regular upper body clothing?: None Help from another person to put on and taking off regular lower body clothing?: A Little 6 Click Score: 23   End of Session Equipment Utilized During Treatment: Gait  belt Nurse Communication: Mobility status  Activity Tolerance: Patient tolerated treatment well(Limited by HA) Patient left: in bed;with call bell/phone within reach;with family/visitor present  OT Visit Diagnosis: Unsteadiness on feet (R26.81);Other abnormalities of gait and  mobility (R26.89);Muscle weakness (generalized) (M62.81);Other symptoms and signs involving cognitive function;Pain Pain - Right/Left: (HA) Pain - part of body: (HA)                Time: 0221-7981 OT Time Calculation (min): 34 min Charges:  OT General Charges $OT Visit: 1 Visit OT Evaluation $OT Eval Moderate Complexity: [place], OTR/L Acute Rehab Pager: [phone] Office: [place] 10/30/2018, 10:34 AM

## 2018-10-30 NOTE — Consult Note (Addendum)
Cardiology Consultation:   Patient ID: Patrick Mclaughlin MRN: [id number]; DOB: 09/09/1996  Admit date: 10/28/2018 Date of Consult: 10/30/2018  Primary Care Provider: Patient, No Pcp Per Primary Cardiologist: No primary care provider on file. New Primary Electrophysiologist:  None    Patient Profile:   Patrick Mclaughlin is a 23 y.o. male with a hx of  vasovagal presyncope / syncope  who is being seen today for the evaluation of bradycardia and hx of syncope .   He was  admitted with syncope and fx skull, AMS, traumatic brain injury and cerebral contusion at the request of Dr. Arnoldo Morale..  History of Present Illness:   Mr. 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   She says he curently does not eat well  Prior to hhat he excercised often  Biked   [employer]old in [employer] scout trip with canoeing in [place] during summer  No difficulty    ON Friday he went to the doctors office. Had not had breakfast when he went   Needed vaccines for trip to [place].  Got more than one shot   On his way out, at check out he got dizzy and passed out, hitting head on way.    The pt currently has a mild headach but is very nauseated.     EKG:  The EKG was personally reviewed and demonstrates:  SR on first EKG second EKG SR but artifact at baseline.   Telemetry:  Telemetry was personally reviewed and demonstrates:  SB to sinus tachycardiac with occaisional junc escape   No significant pauses  Sinus arrhythmia noted   Highest HR 110s (ST)   Past Medical History:  Diagnosis Date  . Acne   . Allergy    seasonal allergies  . Concussion   . Seasonal allergies     Past Surgical History:  Procedure Laterality Date  . NASAL SEPTOPLASTY W/ TURBINOPLASTY  Bilateral 05/05/2016   Procedure: NASAL SEPTOPLASTY WITH TURBINATE REDUCTION;  Surgeon: Rozetta Nunnery, MD;  Location: [facility];  Service: ENT;  Laterality: Bilateral;     Home Medications:  Prior to Admission medications   Medication Sig Start Date End Date Taking? Authorizing Provider  fexofenadine (ALLEGRA) 180 MG tablet Take 180 mg by mouth as needed for allergies or rhinitis.   Yes [provider]  HYDROcodone-acetaminophen (NORCO/VICODIN) 5-325 MG tablet Take 1-2 tablets by mouth every 6 (six) hours as needed for moderate pain. Patient not taking: Reported on 10/28/2018 05/05/16   Rozetta Nunnery, MD    Inpatient Medications: Scheduled Meds:  Continuous Infusions: . sodium chloride Stopped (10/28/18 2324)  . famotidine (PEPCID) IV 20 mg (10/30/18 0944)   PRN Meds: sodium chloride, acetaminophen, HYDROcodone-acetaminophen, ondansetron (ZOFRAN) IV  Allergies:   No Known Allergies  Social History:   Social History   Socioeconomic History  . Marital status: Single    Spouse name: Not on file  . Number of children: Not on file  . Years of education: Not on file  . Highest education level: Not on file  Occupational History  . Not on file  Social Needs  .  Financial resource strain: Not on file  . Food insecurity:    Worry: Not on file    Inability: Not on file  . Transportation needs:    Medical: Not on file    Non-medical: Not on file  Tobacco Use  . Smoking status: Never Smoker  . Smokeless tobacco: Never Used  Substance and Sexual Activity  . Alcohol use: No  . Drug use: No  . Sexual activity: Not on file  Lifestyle  . Physical activity:    Days per week: Not on file    Minutes per session: Not on file  . Stress: Not on file  Relationships  . Social connections:    Talks on phone: Not on file    Gets together: Not on file    Attends religious service: Not on file    Active member of club or organization: Not on file     Attends meetings of clubs or organizations: Not on file    Relationship status: Not on file  . Intimate partner violence:    Fear of current or ex partner: Not on file    Emotionally abused: Not on file    Physically abused: Not on file    Forced sexual activity: Not on file  Other Topics Concern  . Not on file  Social History Narrative  . Not on file    Family History:    Family History  Problem Relation Age of Onset  . Diabetes Father   . COPD Paternal Grandmother      ROS:  Please see the history of present illness.  General:no colds or fevers, no weight changes Skin:no rashes or ulcers HEENT:no blurred vision, no congestion CV:see HPI PUL:see HPI GI:no diarrhea constipation or melena, no indigestion GU:no hematuria, no dysuria MS:no joint pain, no claudication Neuro:no syncope, no lightheadedness Endo:no diabetes, no thyroid disease  All other ROS reviewed and negative.     Physical Exam/Data:   Vitals:   10/29/18 1952 10/29/18 2312 10/30/18 0448 10/30/18 0900  BP: (!) 120/55 125/65 124/67 (!) 125/99  Pulse: 74 76 (!) 53 73  Resp: 15 (!) 24 17 18   Temp: 98.5 F (36.9 C) 98.7 F (37.1 C) 98 F (36.7 C) 98.2 F (36.8 C)  TempSrc: Oral Oral Oral Oral  SpO2: 100% 100% 100% 100%  Weight:        Intake/Output Summary (Last 24 hours) at 10/30/2018 0952 Last data filed at 10/29/2018 1200 Gross per 24 hour  Intake 230.96 ml  Output -  Net 230.96 ml   Last 3 Weights 10/29/2018 05/05/2016 04/22/2016  Weight (lbs) 150 lb 12.7 oz 155 lb 159 lb  Weight (kg) 68.4 kg 70.308 kg 72.122 kg     There is no height or weight on file to calculate BMI.  General:  Well nourished, well developed, thin, in no acute distress, though does have a headache  And nauseated  HEENT: normal Lymph: no adenopathy Neck: no JVD Endocrine:  No thryomegaly Vascular: No carotid bruits; pedal pulses 2+ bilaterally  Cardiac:  normal S1, S2; RRR; no murmur, gallup rub or click  Lungs:  clear  to auscultation bilaterally, no wheezing, rhonchi or rales  Abd: soft, nontender, no hepatomegaly  Ext: no edema Musculoskeletal:  No deformities, BUE and BLE strength normal and equal Skin: warm and dry  Neuro: Deferred   Relevant CV Studies: none  Laboratory Data:  Chemistry Recent Labs  Lab 10/28/18 1213  NA 137  K 4.2  CL 103  CO2 24  GLUCOSE 155*  BUN 18  CREATININE 1.03  CALCIUM 9.2  GFRNONAA >60  GFRAA >60  ANIONGAP 10    No results for input(s): PROT, ALBUMIN, AST, ALT, ALKPHOS, BILITOT in the last 168 hours. Hematology Recent Labs  Lab 10/28/18 1213  WBC 17.2*  RBC 4.96  HGB 14.4  HCT 42.1  MCV 84.9  MCH 29.0  MCHC 34.2  RDW 11.9  PLT 251   Cardiac EnzymesNo results for input(s): TROPONINI in the last 168 hours. No results for input(s): TROPIPOC in the last 168 hours.  BNPNo results for input(s): BNP, PROBNP in the last 168 hours.  DDimer No results for input(s): DDIMER in the last 168 hours.  Radiology/Studies:  Ct Head Wo Contrast  Result Date: 10/29/2018 CLINICAL DATA:  Intracranial hemorrhage follow up EXAM: CT HEAD WITHOUT CONTRAST TECHNIQUE: Contiguous axial images were obtained from the base of the skull through the vertex without intravenous contrast. COMPARISON:  10/28/2018 FINDINGS: Brain: Small left frontal and anterior temporal lobe hemorrhagic contusions. The temporal contusion has very slightly increased in size (measuring approximately 6 mm), but the frontal contusion is unchanged. Unchanged appearance of small volume subarachnoid blood. No hydrocephalus. No midline shift or other mass effect. No new site of hemorrhage. Vascular: No abnormal hyperdensity of the major intracranial arteries or dural venous sinuses. No intracranial atherosclerosis. Skull: Unchanged appearance of sagittally oriented fracture of the occipital bone. Sinuses/Orbits: No fluid levels or advanced mucosal thickening of the visualized paranasal sinuses. No mastoid or  middle ear effusion. The orbits are normal. IMPRESSION: 1. Minimally increased size anterior left temporal lobe hemorrhagic contusion with nearby subarachnoid blood 2. Unchanged small left frontal lobe hemorrhagic contusion with associated subarachnoid blood. 3. Unchanged appearance of occipital bone fracture. Electronically Signed   By: Ulyses Jarred M.D.   On: 10/29/2018 05:39   Ct Head Wo Contrast  Result Date: 10/28/2018 CLINICAL DATA:  23 year old male with history of head trauma presenting with headache. EXAM: CT HEAD WITHOUT CONTRAST CT CERVICAL SPINE WITHOUT CONTRAST TECHNIQUE: Multidetector CT imaging of the head and cervical spine was performed following the standard protocol without intravenous contrast. Multiplanar CT image reconstructions of the cervical spine were also generated. COMPARISON:  No priors. FINDINGS: CT HEAD FINDINGS Brain: In the left frontal lobe best appreciated on axial image 15 of series 4 and sagittal image 43 of series 16 there is a small amount of high attenuation material. Some of this extends into the sulci and overlying the convexity, indicative of subarachnoid hemorrhage. There is a more nodular focus measuring 6 x 9 x 8 mm, suspicious for focal cortical hemorrhagic contusion. No evidence of hydrocephalus or mass lesion/mass effect. Vascular: No hyperdense vessel or unexpected calcification. Skull: Sagittally oriented midline fracture through the occipital bone extending to the level of the foramen magnum. Sinuses/Orbits: Left-sided turbinates have been surgically resected. No acute finding. Other: None. CT CERVICAL SPINE FINDINGS Alignment: Normal. Skull base and vertebrae: Sagittally oriented midline fracture through the occipital bone extending to the level of the foramen magnum. No acute displaced fracture of the cervical spine. Soft tissues and spinal canal: No prevertebral fluid or swelling. No visible canal hematoma. Disc levels: No significant degenerative disc  disease or facet arthropathy. Upper chest: Negative. Other: 1.3 x 1.0 cm partially calcified nodule in the left lobe of the thyroid gland. IMPRESSION: 1. Sagittally oriented midline fracture through the occipital bone extending to the level of the foramen magnum. 2. Small amount of left frontal hemorrhage. This  appears to be both a very small left frontal lobe cortical hemorrhagic contusion, and some adjacent subarachnoid hemorrhage, as detailed above. 3. No evidence of significant acute traumatic injury to the cervical spine. 4. 1.3 x 1.0 cm partially calcified nodule in the left lobe of the thyroid gland. Further evaluation with nonemergent thyroid ultrasound is recommended in the near future to better evaluate this finding and determine if there is a need to further evaluate this lesion with fine-needle aspiration. This recommendation follows ACR consensus guidelines: Managing Incidental Thyroid Nodules Detected on Imaging: White Paper of the ACR Incidental Thyroid Findings Committee. J Am Coll Radiol 2015;12(2):143-150. Critical Value/emergent results were called by telephone at the time of interpretation on 10/28/2018 at 12:10 pm to Dr. Noemi Chapel, who verbally acknowledged these results. Electronically Signed   By: Vinnie Langton M.D.   On: 10/28/2018 12:12   Ct Cervical Spine Wo Contrast  Result Date: 10/28/2018 CLINICAL DATA:  23 year old male with history of head trauma presenting with headache. EXAM: CT HEAD WITHOUT CONTRAST CT CERVICAL SPINE WITHOUT CONTRAST TECHNIQUE: Multidetector CT imaging of the head and cervical spine was performed following the standard protocol without intravenous contrast. Multiplanar CT image reconstructions of the cervical spine were also generated. COMPARISON:  No priors. FINDINGS: CT HEAD FINDINGS Brain: In the left frontal lobe best appreciated on axial image 15 of series 4 and sagittal image 43 of series 16 there is a small amount of high attenuation material. Some  of this extends into the sulci and overlying the convexity, indicative of subarachnoid hemorrhage. There is a more nodular focus measuring 6 x 9 x 8 mm, suspicious for focal cortical hemorrhagic contusion. No evidence of hydrocephalus or mass lesion/mass effect. Vascular: No hyperdense vessel or unexpected calcification. Skull: Sagittally oriented midline fracture through the occipital bone extending to the level of the foramen magnum. Sinuses/Orbits: Left-sided turbinates have been surgically resected. No acute finding. Other: None. CT CERVICAL SPINE FINDINGS Alignment: Normal. Skull base and vertebrae: Sagittally oriented midline fracture through the occipital bone extending to the level of the foramen magnum. No acute displaced fracture of the cervical spine. Soft tissues and spinal canal: No prevertebral fluid or swelling. No visible canal hematoma. Disc levels: No significant degenerative disc disease or facet arthropathy. Upper chest: Negative. Other: 1.3 x 1.0 cm partially calcified nodule in the left lobe of the thyroid gland. IMPRESSION: 1. Sagittally oriented midline fracture through the occipital bone extending to the level of the foramen magnum. 2. Small amount of left frontal hemorrhage. This appears to be both a very small left frontal lobe cortical hemorrhagic contusion, and some adjacent subarachnoid hemorrhage, as detailed above. 3. No evidence of significant acute traumatic injury to the cervical spine. 4. 1.3 x 1.0 cm partially calcified nodule in the left lobe of the thyroid gland. Further evaluation with nonemergent thyroid ultrasound is recommended in the near future to better evaluate this finding and determine if there is a need to further evaluate this lesion with fine-needle aspiration. This recommendation follows ACR consensus guidelines: Managing Incidental Thyroid Nodules Detected on Imaging: White Paper of the ACR Incidental Thyroid Findings Committee. J Am Coll Radiol  2015;12(2):143-150. Critical Value/emergent results were called by telephone at the time of interpretation on 10/28/2018 at 12:10 pm to Dr. Noemi Chapel, who verbally acknowledged these results. Electronically Signed   By: Vinnie Langton M.D.   On: 10/28/2018 12:12    Assessment and Plan:    1. Vasovagal syncope - with hx of this with  lab draws and injections..   2. Sinus arrhythmia with rate in 30s at times.  Pt not fatigued and able to be active in his usual health.   3. Fractured skull with cerebral contusion per Neurology.           For questions or updates, please contact [place] Please consult [email or web address] for contact info under   Signed, Cecilie Kicks, NP  10/30/2018 9:52 AM  Pt seen and examined   I have amended note above, including Hx and PE to reflect my findings  Pt is a previously healthy 23 yo with long history of vasavagal type events.   Unfortunately, he had a signif spell this week with resultant history.   NO signif pauses or bradycardia on tele.   His HR does not appear to be too slow    Exam is unremarkable  I think the pt has enhanced vagal activity and in the setting of baseline bradycardia (trained) and certain loading conditions (ex. Relative dehydration (he did not eat breakfist)) he passes out    Goal is to avoid/minimize the chance for these events to occur Stay hydrated,   Increase salt intake  Avoid prolonged standing in one place   Move around    For events that could predispose to syncope (cuts, shots. Witnessing trauma) he should take extra precautions (sit quickly and for [facility])  I will come back to examine pt again and discuss these things with him    He was feeling poorly today due to nausea and headache  Dorris Carnes

## 2018-10-30 NOTE — Progress Notes (Signed)
Back in patient room 1930 to re-evaluate after pain medication given, patient resting in bed, eyes closed even respirations. Family requesting patient take a walk, patient verbalizes that he does not want to walk. After multiple attempts of convincing patient to ambulate, patient verbalizes compliance with walking ans takes a walk in the hallway. Three family members are present with patient during his walk. Family voices concern about his safety and patient not wanting to interact or participate with care. Explained to family that the process of healing a brain injury takes time and will be a slow process. Family verbalizes understanding but expresses fear and anxiety (based off verbal tone and body language) about this process. Assured family that we are with them and will continue to monitor and care for patient to improve his care. Mother states she will be staying the night with patient, provided pillows and blankets as requested.

## 2018-10-30 NOTE — Progress Notes (Signed)
Subjective: The patient is alert and pleasant.  His father is at the bedside.  He is improving.  Objective: Vital signs in last 24 hours: Temp:  [98 F (36.7 C)-98.7 F (37.1 C)] 98 F (36.7 C) (01/19 0448) Pulse Rate:  [47-76] 53 (01/19 0448) Resp:  [10-24] 17 (01/19 0448) BP: (110-125)/(51-72) 124/67 (01/19 0448) SpO2:  [98 %-100 %] 100 % (01/19 0448) Estimated body mass index is 21.62 kg/m as calculated from the following:   Height as of 05/05/16: 5\' 11"  (1.803 m).   Weight as of 05/05/16: 70.3 kg.   Intake/Output from previous day: 01/18 0701 - 01/19 0700 In: 231 [P.O.:180; IV Piggyback:51] Out: -  Intake/Output this shift: No intake/output data recorded.  Physical exam the patient is alert and oriented x2, person and a hospital.  His speech is normal.  His strength is normal.  Lab Results: Recent Labs    10/28/18 1213  WBC 17.2*  HGB 14.4  HCT 42.1  PLT 251   BMET Recent Labs    10/28/18 1213  NA 137  K 4.2  CL 103  CO2 24  GLUCOSE 155*  BUN 18  CREATININE 1.03  CALCIUM 9.2    Studies/Results: Ct Head Wo Contrast  Result Date: 10/29/2018 CLINICAL DATA:  Intracranial hemorrhage follow up EXAM: CT HEAD WITHOUT CONTRAST TECHNIQUE: Contiguous axial images were obtained from the base of the skull through the vertex without intravenous contrast. COMPARISON:  10/28/2018 FINDINGS: Brain: Small left frontal and anterior temporal lobe hemorrhagic contusions. The temporal contusion has very slightly increased in size (measuring approximately 6 mm), but the frontal contusion is unchanged. Unchanged appearance of small volume subarachnoid blood. No hydrocephalus. No midline shift or other mass effect. No new site of hemorrhage. Vascular: No abnormal hyperdensity of the major intracranial arteries or dural venous sinuses. No intracranial atherosclerosis. Skull: Unchanged appearance of sagittally oriented fracture of the occipital bone. Sinuses/Orbits: No fluid levels or  advanced mucosal thickening of the visualized paranasal sinuses. No mastoid or middle ear effusion. The orbits are normal. IMPRESSION: 1. Minimally increased size anterior left temporal lobe hemorrhagic contusion with nearby subarachnoid blood 2. Unchanged small left frontal lobe hemorrhagic contusion with associated subarachnoid blood. 3. Unchanged appearance of occipital bone fracture. Electronically Signed   By: Ulyses Jarred M.D.   On: 10/29/2018 05:39   Ct Head Wo Contrast  Result Date: 10/28/2018 CLINICAL DATA:  23 year old male with history of head trauma presenting with headache. EXAM: CT HEAD WITHOUT CONTRAST CT CERVICAL SPINE WITHOUT CONTRAST TECHNIQUE: Multidetector CT imaging of the head and cervical spine was performed following the standard protocol without intravenous contrast. Multiplanar CT image reconstructions of the cervical spine were also generated. COMPARISON:  No priors. FINDINGS: CT HEAD FINDINGS Brain: In the left frontal lobe best appreciated on axial image 15 of series 4 and sagittal image 43 of series 16 there is a small amount of high attenuation material. Some of this extends into the sulci and overlying the convexity, indicative of subarachnoid hemorrhage. There is a more nodular focus measuring 6 x 9 x 8 mm, suspicious for focal cortical hemorrhagic contusion. No evidence of hydrocephalus or mass lesion/mass effect. Vascular: No hyperdense vessel or unexpected calcification. Skull: Sagittally oriented midline fracture through the occipital bone extending to the level of the foramen magnum. Sinuses/Orbits: Left-sided turbinates have been surgically resected. No acute finding. Other: None. CT CERVICAL SPINE FINDINGS Alignment: Normal. Skull base and vertebrae: Sagittally oriented midline fracture through the occipital bone extending to the  level of the foramen magnum. No acute displaced fracture of the cervical spine. Soft tissues and spinal canal: No prevertebral fluid or  swelling. No visible canal hematoma. Disc levels: No significant degenerative disc disease or facet arthropathy. Upper chest: Negative. Other: 1.3 x 1.0 cm partially calcified nodule in the left lobe of the thyroid gland. IMPRESSION: 1. Sagittally oriented midline fracture through the occipital bone extending to the level of the foramen magnum. 2. Small amount of left frontal hemorrhage. This appears to be both a very small left frontal lobe cortical hemorrhagic contusion, and some adjacent subarachnoid hemorrhage, as detailed above. 3. No evidence of significant acute traumatic injury to the cervical spine. 4. 1.3 x 1.0 cm partially calcified nodule in the left lobe of the thyroid gland. Further evaluation with nonemergent thyroid ultrasound is recommended in the near future to better evaluate this finding and determine if there is a need to further evaluate this lesion with fine-needle aspiration. This recommendation follows ACR consensus guidelines: Managing Incidental Thyroid Nodules Detected on Imaging: White Paper of the ACR Incidental Thyroid Findings Committee. J Am Coll Radiol 2015;12(2):143-150. Critical Value/emergent results were called by telephone at the time of interpretation on 10/28/2018 at 12:10 pm to Dr. Noemi Chapel, who verbally acknowledged these results. Electronically Signed   By: Vinnie Langton M.D.   On: 10/28/2018 12:12   Ct Cervical Spine Wo Contrast  Result Date: 10/28/2018 CLINICAL DATA:  23 year old male with history of head trauma presenting with headache. EXAM: CT HEAD WITHOUT CONTRAST CT CERVICAL SPINE WITHOUT CONTRAST TECHNIQUE: Multidetector CT imaging of the head and cervical spine was performed following the standard protocol without intravenous contrast. Multiplanar CT image reconstructions of the cervical spine were also generated. COMPARISON:  No priors. FINDINGS: CT HEAD FINDINGS Brain: In the left frontal lobe best appreciated on axial image 15 of series 4 and sagittal  image 43 of series 16 there is a small amount of high attenuation material. Some of this extends into the sulci and overlying the convexity, indicative of subarachnoid hemorrhage. There is a more nodular focus measuring 6 x 9 x 8 mm, suspicious for focal cortical hemorrhagic contusion. No evidence of hydrocephalus or mass lesion/mass effect. Vascular: No hyperdense vessel or unexpected calcification. Skull: Sagittally oriented midline fracture through the occipital bone extending to the level of the foramen magnum. Sinuses/Orbits: Left-sided turbinates have been surgically resected. No acute finding. Other: None. CT CERVICAL SPINE FINDINGS Alignment: Normal. Skull base and vertebrae: Sagittally oriented midline fracture through the occipital bone extending to the level of the foramen magnum. No acute displaced fracture of the cervical spine. Soft tissues and spinal canal: No prevertebral fluid or swelling. No visible canal hematoma. Disc levels: No significant degenerative disc disease or facet arthropathy. Upper chest: Negative. Other: 1.3 x 1.0 cm partially calcified nodule in the left lobe of the thyroid gland. IMPRESSION: 1. Sagittally oriented midline fracture through the occipital bone extending to the level of the foramen magnum. 2. Small amount of left frontal hemorrhage. This appears to be both a very small left frontal lobe cortical hemorrhagic contusion, and some adjacent subarachnoid hemorrhage, as detailed above. 3. No evidence of significant acute traumatic injury to the cervical spine. 4. 1.3 x 1.0 cm partially calcified nodule in the left lobe of the thyroid gland. Further evaluation with nonemergent thyroid ultrasound is recommended in the near future to better evaluate this finding and determine if there is a need to further evaluate this lesion with fine-needle aspiration. This recommendation follows  ACR consensus guidelines: Managing Incidental Thyroid Nodules Detected on Imaging: White Paper of  the ACR Incidental Thyroid Findings Committee. J Am Coll Radiol 2015;12(2):143-150. Critical Value/emergent results were called by telephone at the time of interpretation on 10/28/2018 at 12:10 pm to Dr. Noemi Chapel, who verbally acknowledged these results. Electronically Signed   By: Vinnie Langton M.D.   On: 10/28/2018 12:12    Assessment/Plan: Skull fracture, traumatic brain injury, cerebral contusions: The patient is progressing well.  Bradycardia, syncope: I think his bradycardia is likely secondary to his young age, being thin, etc.  But since he has had a couple syncopal episodes and heart rate in the 40s, we will check an EKG and ask cardiology to see the patient.  LOS: 2 days     Ophelia Charter 10/30/2018, 8:54 AM

## 2018-10-31 DIAGNOSIS — D72829 Elevated white blood cell count, unspecified: Secondary | ICD-10-CM

## 2018-10-31 DIAGNOSIS — I609 Nontraumatic subarachnoid hemorrhage, unspecified: Secondary | ICD-10-CM

## 2018-10-31 DIAGNOSIS — S06339A Contusion and laceration of cerebrum, unspecified, with loss of consciousness of unspecified duration, initial encounter: Secondary | ICD-10-CM

## 2018-10-31 NOTE — Progress Notes (Signed)
Physical Therapy Treatment Patient Details Name: Patrick Mclaughlin MRN: [id number] DOB: [birth year] Today's Date: 10/31/2018    History of Present Illness The patient is a 23 year old white male who by report has had syncopal events with needles/blood, etc. in the past.  He has some injections and a primary doctor's office today and had a syncopal event falling and striking his head.  There was a brief loss of consciousness and mental status changes.  He was brought to [facility], ER where head scan was obtained.  This demonstrated a skull fracture and a cerebral contusion;  has a past medical history of Acne, Allergy, Concussion, and Seasonal allergies.    PT Comments    Pt is making progress in both his cognition and mobility.  He continues to have limited tolerance of cognitive and physical stimulation (and even less when combined with each other).  His STM is improving, and carryover is improving as well.  I spent a long period of time educating his family about TBI symptoms and expected (and unexpected) progression.  They had many questions and fears about taking him home in the near future and we talked some of them out.  He will need to practice stairs before returning home as his bedroom is upstairs.    Follow Up Recommendations  Outpatient PT(at a neuro rehabilitation center)     Equipment Recommendations  None recommended by PT    Recommendations for Other Services   NA     Precautions / Restrictions Precautions Precautions: Other (comment) Precaution Comments: TBI Restrictions Weight Bearing Restrictions: No    Mobility  Bed Mobility Overal bed mobility: Needs Assistance Bed Mobility: Supine to Sit     Supine to sit: Supervision     General bed mobility comments: Supervision for safety and line management  Transfers Overall transfer level: Needs assistance Equipment used: None Transfers: Sit to/from Stand Sit to Stand: Supervision         General transfer  comment: supervision for safety  Ambulation/Gait Ambulation/Gait assistance: Supervision Gait Distance (Feet): 600 Feet Assistive device: None Gait Pattern/deviations: Step-through pattern Gait velocity: WNL   General Gait Details: very mildly staggering gait pattern, only reported dizziness when initially standing and again when asked to pick up and object from the floor.        Balance Overall balance assessment: Needs assistance Sitting-balance support: Feet supported;No upper extremity supported Sitting balance-Leahy Scale: Good     Standing balance support: No upper extremity supported Standing balance-Leahy Scale: Good               High level balance activites: Backward walking;Direction changes;Sudden stops;Other (comment)(pick up object from the ground) High Level Balance Comments: supervision for safety, limited DGI due to increased HA pain.  Standardized Balance Assessment Standardized Balance Assessment : Dynamic Gait Index   Dynamic Gait Index Level Surface: Normal Change in Gait Speed: Normal Gait with Horizontal Head Turns: Normal Gait with Vertical Head Turns: Normal Gait and Pivot Turn: Normal Step Over Obstacle: Mild Impairment      Cognition Arousal/Alertness: Awake/alert Behavior During Therapy: WFL for tasks assessed/performed Overall Cognitive Status: Impaired/Different from baseline Area of Impairment: Memory;Awareness;Problem solving;Safety/judgement                     Memory: Decreased short-term memory   Safety/Judgement: Decreased awareness of deficits Awareness: Emergent Problem Solving: Slow processing;Requires tactile cues General Comments: Pt performing four part trail making task. Pt able to recall 3/4 locations (forgetting the last  location) Pt with difficulty locating his room at end of task requiring Max cues to look for external cues to problem solve location of room. Pt with increased irritiability and frustration at  end of trail making task; though continued to be polite with therapist and family. Pt unable to recall plan to take shower after therapy and required Max cues for recall. Due to increased headache after therapy, pt returning to supine with lights off. Discussed with family TBI symptoms and progression.          General Comments General comments (skin integrity, edema, etc.): Lengthy education to family (mom, Dad, and sister, Patrick Mclaughlin) Re: head injury expected symptoms, progression of both physical and cognitive stimulation, triggers to look for and external cues that he is having a HA or triggering symptoms of a concussion.        Pertinent Vitals/Pain Pain Assessment: 0-10 Pain Score: 6 (3 before the session, 6 at the end of the session) Pain Location: Headache Pain Descriptors / Indicators: Constant;Headache Pain Intervention(s): Limited activity within patient's tolerance;Monitored during session;Repositioned    Home Living Family/patient expects to be discharged to:: Private residence Living Arrangements: Parent;Non-relatives/Friends(lives with parents when not at school) Available Help at Discharge: Family;Available 24 hours/day Type of Home: House Home Access: Stairs to enter Entrance Stairs-Rails: Right Home Layout: Two level;Bed/bath upstairs Home Equipment: None Additional Comments: Loft bed - bunk over desk    Prior Function Level of Independence: Independent      Comments: Student at [employer]. Enjoys cycling. Has an internship in [place]   PT Goals (current goals can now be found in the care plan section) Acute Rehab PT Goals Patient Stated Goal: "Get better so I can go to my internship" Progress towards PT goals: Progressing toward goals    Frequency    Min 3X/week      PT Plan Frequency needs to be updated    Co-evaluation PT/OT/SLP Co-Evaluation/Treatment: Yes Reason for Co-Treatment: Complexity of the patient's impairments (multi-system  involvement);Necessary to address cognition/behavior during functional activity PT goals addressed during session: Mobility/safety with mobility;Balance OT goals addressed during session: ADL's and self-care      AM-PAC PT "6 Clicks" Mobility   Outcome Measure  Help needed turning from your back to your side while in a flat bed without using bedrails?: None Help needed moving from lying on your back to sitting on the side of a flat bed without using bedrails?: None Help needed moving to and from a bed to a chair (including a wheelchair)?: None Help needed standing up from a chair using your arms (e.g., wheelchair or bedside chair)?: None Help needed to walk in hospital room?: None Help needed climbing 3-5 steps with a railing? : A Little 6 Click Score: 23    End of Session   Activity Tolerance: Patient limited by pain Patient left: in bed;with call bell/phone within reach;with family/visitor present   PT Visit Diagnosis: Other abnormalities of gait and mobility (R26.89);Other symptoms and signs involving the nervous system (R29.898)     Time: 4818-5631 PT Time Calculation (min) (ACUTE ONLY): 68 min  Charges:  $Gait Training: 8-22 mins $Self Care/Home Management: [place] Kathelyn Gombos, PT, DPT  Acute Rehabilitation [phone] pager #[phone] office   10/31/2018, 6:35 PM

## 2018-10-31 NOTE — Progress Notes (Signed)
IP rehab admissions - Please see consult done by Dr. Posey Pronto today.  Patient is already doing too well to meet criteria for acute inpatient rehab admission.  Recommend outpatient therapies.  Call me for questions.  [phone]

## 2018-10-31 NOTE — Progress Notes (Signed)
Pt refused to shower.  RN and NT asked pt multiple times in this shift.  RN will continue to monitor.

## 2018-10-31 NOTE — Progress Notes (Signed)
   Progress Note  Patient Name: Patrick Mclaughlin Date of Encounter: 10/31/2018  Primary Cardiologist:New  Subjective   Still with mild HA    Light sensitive   No nausea  Inpatient Medications    Scheduled Meds: . famotidine  20 mg Oral BID   Continuous Infusions: . sodium chloride Stopped (10/28/18 2324)   PRN Meds: sodium chloride, acetaminophen, HYDROcodone-acetaminophen, ondansetron (ZOFRAN) IV   Vital Signs    Vitals:   10/30/18 2040 10/30/18 2300 10/31/18 0436 10/31/18 0439  BP: (!) 109/54 112/65 122/65 122/65  Pulse: (!) 49 (!) 53 (!) 54 (!) 50  Resp: 16 20 (!) 22 13  Temp: 98.3 F (36.8 C) 98.7 F (37.1 C) 98.3 F (36.8 C)   TempSrc: Oral Oral Oral   SpO2: 99% 96% 97% 98%  Weight:        Intake/Output Summary (Last 24 hours) at 10/31/2018 0802 Last data filed at 10/30/2018 1300 Gross per 24 hour  Intake 120 ml  Output -  Net 120 ml   Last 3 Weights 10/29/2018 05/05/2016 04/22/2016  Weight (lbs) 150 lb 12.7 oz 155 lb 159 lb  Weight (kg) 68.4 kg 70.308 kg 72.122 kg      Telemetry    SR   Personally Reviewed  ECG      Physical Exam   GEN: No acute distress.   Neck: No JVD Cardiac: RRR, no murmurs, rubs, or gallops.  Respiratory: Clear to auscultation bilaterally. GI: Soft, nontender, non-distended  MS: No edema; No deformity. Neuro:  Nonfocal  Psych: Normal affect   Labs    Chemistry Recent Labs  Lab 10/28/18 1213  NA 137  K 4.2  CL 103  CO2 24  GLUCOSE 155*  BUN 18  CREATININE 1.03  CALCIUM 9.2  GFRNONAA >60  GFRAA >60  ANIONGAP 10     Hematology Recent Labs  Lab 10/28/18 1213  WBC 17.2*  RBC 4.96  HGB 14.4  HCT 42.1  MCV 84.9  MCH 29.0  MCHC 34.2  RDW 11.9  PLT 251    Cardiac EnzymesNo results for input(s): TROPONINI in the last 168 hours. No results for input(s): TROPIPOC in the last 168 hours.   BNPNo results for input(s): BNP, PROBNP in the last 168 hours.   DDimer No results for input(s): DDIMER in the last  168 hours.   Radiology    No results found.  Cardiac Studies     Patient Profile     23 y.o. male with history of syncope  Admitted after closed head injury from fall  Assessment & Plan     1   Syncope Pt with long hx of vasovagal symptoms   Only one episode of syncope  I have reviewed mechanism, triggers, Rx with family. No changes for now   Will review with pt when he is feeling a little better  Dorris Carnes  For questions or updates, please contact [place] Please consult [email or web address] for contact info under        Signed, Dorris Carnes, MD  10/31/2018, 8:02 AM

## 2018-10-31 NOTE — Progress Notes (Signed)
Subjective: The patient is without change.  He complains of a headache.  He is at times impulsive.  His mother is at the bedside.  Objective: Vital signs in last 24 hours: Temp:  [97.4 F (36.3 C)-98.7 F (37.1 C)] 97.9 F (36.6 C) (01/20 0815) Pulse Rate:  [48-81] 48 (01/20 1000) Resp:  [13-22] 17 (01/20 1000) BP: (109-122)/(54-69) 118/68 (01/20 0815) SpO2:  [96 %-100 %] 97 % (01/20 1000) Estimated body mass index is 21.62 kg/m as calculated from the following:   Height as of 05/05/16: 5\' 11"  (1.803 m).   Weight as of 05/05/16: 70.3 kg.   Intake/Output from previous day: 01/19 0701 - 01/20 0700 In: 140 [P.O.:140] Out: -  Intake/Output this shift: No intake/output data recorded.  Physical exam the patient is alert and pleasant.  He moves all 4 extremities well.  His speech is normal.  He continues to intermittently give inappropriate answers to questions.  Lab Results: Recent Labs    10/28/18 1213  WBC 17.2*  HGB 14.4  HCT 42.1  PLT 251   BMET Recent Labs    10/28/18 1213  NA 137  K 4.2  CL 103  CO2 24  GLUCOSE 155*  BUN 18  CREATININE 1.03  CALCIUM 9.2    Studies/Results: No results found.  Assessment/Plan: Cerebral contusions, skull fracture, traumatic brain injury: I will asked the rehab team to see the patient.  Perhaps he would benefit from a short stay in rehab.  LOS: 3 days     Ophelia Charter 10/31/2018, 11:34 AM

## 2018-10-31 NOTE — Progress Notes (Signed)
Occupational Therapy Treatment Patient Details Name: Patrick Mclaughlin MRN: [id number] DOB: 03/29/1996 Today's Date: 10/31/2018    History of present illness The patient is a 23 year old white male who by report has had syncopal events with needles/blood, etc. in the past.  He has some injections and a primary doctor's office today and had a syncopal event falling and striking his head.  There was a brief loss of consciousness and mental status changes.  He was brought to [facility], ER where head scan was obtained.  This demonstrated a skull fracture and a cerebral contusion;  has a past medical history of Acne, Allergy, Concussion, and Seasonal allergies.   OT comments  Pt progressing towards established OT goals. Pt performing four part trail making task. Pt able to recall 3/4 locations during trail making task with Mod cues for locating last destination. Pt requiring Max cues for locating his room after activity and to recall plan to shower after therapy. Due to increased HA with movement, cognitive task, and stimuli, pt requesting to rest and hold shower; RN/NT notified. Discussed with family TBI symptoms and progression as well as required rest with HA and to gradually increase activity. Continue to recommend Neuro OP OT and will continue to follow.    Follow Up Recommendations  Outpatient OT;Supervision/Assistance - 24 hour(Neuro OP OT)    Equipment Recommendations  None recommended by OT    Recommendations for Other Services      Precautions / Restrictions Precautions Precautions: Other (comment) Precaution Comments: TBI Restrictions Weight Bearing Restrictions: No       Mobility Bed Mobility Overal bed mobility: Needs Assistance Bed Mobility: Supine to Sit     Supine to sit: Supervision     General bed mobility comments: Supervision for safety and line management  Transfers Overall transfer level: Needs assistance Equipment used: None Transfers: Sit to/from Stand Sit  to Stand: Supervision         General transfer comment: supervision for safety    Balance Overall balance assessment: Mild deficits observed, not formally tested                                         ADL either performed or assessed with clinical judgement   ADL Overall ADL's : Needs assistance/impaired                                     Functional mobility during ADLs: Min guard;Supervision/safety General ADL Comments: Pt performing four part trail making task. Continues to present with decreased ST memory and problem solving. Demonstrating increased tolerance of cognitive activity.     Vision Baseline Vision/History: Wears glasses Wears Glasses: At all times Patient Visual Report: No change from baseline(Denies blurry or double vision)     Perception     Praxis      Cognition Arousal/Alertness: Awake/alert Behavior During Therapy: WFL for tasks assessed/performed Overall Cognitive Status: Impaired/Different from baseline Area of Impairment: Memory;Awareness;Problem solving;Safety/judgement                     Memory: Decreased short-term memory   Safety/Judgement: Decreased awareness of deficits Awareness: Emergent Problem Solving: Slow processing;Requires tactile cues General Comments: Pt performing four part trail making task. Pt able to recall 3/4 locations (forgetting the last location) Pt with difficulty locating his  room at end of task requiring Max cues to look for external cues to problem solve location of room. Pt with increased irritiability and frustration at end of trail making task; though continued to be polite with therapist and family. Pt unable to recall plan to take shower after therapy and required Max cues for recall. Due to increased headache after therapy, pt returning to supine with lights off. Discussed with family TBI symptoms and progression.         Exercises     Shoulder Instructions        General Comments Lengthy conversation with parents about TBI symptoms    Pertinent Vitals/ Pain       Pain Assessment: 0-10 Pain Score: 6 (3 at begining of session) Pain Location: Headache Pain Descriptors / Indicators: Constant;Headache Pain Intervention(s): Monitored during session;Limited activity within patient's tolerance;Repositioned;Other (comment)(decreased stimulation at end of session)  Home Living Family/patient expects to be discharged to:: Private residence Living Arrangements: Parent;Non-relatives/Friends(lives with parents when not at school) Available Help at Discharge: Family;Available 24 hours/day Type of Home: House Home Access: Stairs to enter [employer] of Steps: 4 Entrance Stairs-Rails: Right Home Layout: Two level;Bed/bath upstairs Alternate Level Stairs-Number of Steps: 8 Alternate Level Stairs-Rails: Left Bathroom Shower/Tub: Teacher, early years/pre: Standard     Home Equipment: None   Additional Comments: Loft bed - bunk over desk      Prior Functioning/Environment Level of Independence: Independent        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        Progress Toward Goals  OT Goals(current goals can now be found in the care plan section)  Progress towards OT goals: Progressing toward goals  Acute Rehab OT Goals Patient Stated Goal: "Get better so I can go to my internship" OT Goal Formulation: With patient Time For Goal Achievement: 11/13/18 Potential to Achieve Goals: Good ADL Goals Additional ADL Goal #1: Pt will perform ADLs at Mod I level with increased time as needed Additional ADL Goal #2: Pt will perform four part trail making task with 1-2 cues Additional ADL Goal #3: Pt will demonstrate increased ST memory to perform three ADLs with 1-2 for recall  Plan Discharge plan remains appropriate    Co-evaluation    PT/OT/SLP  Co-Evaluation/Treatment: Yes Reason for Co-Treatment: Other (comment);To address functional/ADL transfers(for activity tolerance due to headaches)   OT goals addressed during session: ADL's and self-care      AM-PAC OT "6 Clicks" Daily Activity     Outcome Measure   Help from another person eating meals?: None Help from another person taking care of personal grooming?: None Help from another person toileting, which includes using toliet, bedpan, or urinal?: None Help from another person bathing (including washing, rinsing, drying)?: None Help from another person to put on and taking off regular upper body clothing?: None Help from another person to put on and taking off regular lower body clothing?: A Little 6 Click Score: 23    End of Session    OT Visit Diagnosis: Unsteadiness on feet (R26.81);Other abnormalities of gait and mobility (R26.89);Muscle weakness (generalized) (M62.81);Other symptoms and signs involving cognitive function;Pain Pain - Right/Left: (HA) Pain - part of body: (HA)   Activity Tolerance Patient tolerated treatment well(Limited by HA)   Patient Left in bed;with call bell/phone within reach;with family/visitor present   Nurse Communication Mobility status  Time: 7903-8333 OT Time Calculation (min): 48 min  Charges: OT General Charges $OT Visit: 1 Visit OT Treatments $Self Care/Home Management : 23-37 mins  Sierra Brooks, OTR/L Acute Rehab Pager: [phone] Office: [place] 10/31/2018, 5:42 PM

## 2018-10-31 NOTE — Consult Note (Signed)
Physical Medicine and Rehabilitation Consult   Reason for Consult: TBI Referring Physician: Dr. Arnoldo Morale.    HPI: Patrick Mclaughlin is a 23 y.o. male with history of allergies, acne who fell and struck his head while getting vaccination at MD office on 10/28/18.  History taken from chart review and father.  Patient with history of dizziness and faint feeling at sight of bleed and with blood draws in the past.  He has brief LOC with MS changes and taken to ED for evaluation.  CT head done revealing midline fracture thorough occipital bone extending to level at foramen magnum and small left frontal hemorrhage. UDS negative. Dr. Arnoldo Morale consulted and recommended serial CT head for monitoring.  Follow-up CT reviewed, showing improvement in bleed.  Cardiology consulted for input and felt that vasovagal syncope due to history of triggers. He was educated on taking extra precautions to prevent further predisposing episodes.  Headaches and nausea resolving. MD recommending CIR.    Review of Systems  Constitutional: Positive for malaise/fatigue. Negative for chills and fever.  Eyes: Positive for blurred vision and photophobia.  Respiratory: Negative for shortness of breath.   Cardiovascular: Negative for chest pain.  Gastrointestinal: Negative.   Neurological: Positive for weakness and headaches. Negative for sensory change and speech change.  All other systems reviewed and are negative.  Past Medical History:  Diagnosis Date  . Acne   . Allergy    seasonal allergies  . Concussion   . Seasonal allergies     Past Surgical History:  Procedure Laterality Date  . NASAL SEPTOPLASTY W/ TURBINOPLASTY Bilateral 05/05/2016   Procedure: NASAL SEPTOPLASTY WITH TURBINATE REDUCTION;  Surgeon: Rozetta Nunnery, MD;  Location: [facility];  Service: ENT;  Laterality: Bilateral;    Family History  Problem Relation Age of Onset  . Diabetes Father   . COPD Paternal Grandmother      Social History: Ship broker at [employer]   reports that he has never smoked. He has never used smokeless tobacco. He reports that he does not drink alcohol or use drugs.    Allergies: No Known Allergies Medications Prior to Admission  Medication Sig Dispense Refill  . fexofenadine (ALLEGRA) 180 MG tablet Take 180 mg by mouth as needed for allergies or rhinitis.    Marland Kitchen HYDROcodone-acetaminophen (NORCO/VICODIN) 5-325 MG tablet Take 1-2 tablets by mouth every 6 (six) hours as needed for moderate pain. (Patient not taking: Reported on 10/28/2018) 20 tablet 0    Home: [place] expects to be discharged to:: Private residence Living Arrangements: Parent, Non-relatives/Friends(lives with parents when not at school) Available Help at Discharge: Family, Available 24 hours/day Type of Home: House Home Access: Stairs to enter [employer] of Steps: 4 Entrance Stairs-Rails: Right Home Layout: Two level, Bed/bath upstairs Alternate Level Stairs-Number of Steps: 8 Alternate Level Stairs-Rails: Left Bathroom Shower/Tub: Chiropodist: Standard Home Equipment: None Additional Comments: Loft bed - bunk over desk  Functional History: Prior Function Level of Independence: Independent 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[place] Status:  Mobility: Bed Mobility Overal bed mobility: Needs Assistance Bed Mobility: Supine to Sit Supine to sit: Supervision General bed mobility comments: Supervision for safety and line management Transfers Overall transfer level: Needs assistance Equipment used: None Transfers: Sit to/from Stand Sit to Stand: Min guard General transfer comment: [place] for safety; cues to self-monitor fo ractivity tolerance Ambulation/Gait Ambulation/Gait assistance: Min guard Gait Distance (Feet): 550  Feet Assistive device: None Gait Pattern/deviations: Step-through pattern General Gait  Details: Walked entire 4N floor without assistive device, with no gross loss of balance; presented with problem-solving tasks during walk; correctly answered math problem quickly, more difficulty with verbal oriented problem; difficulty multi-tasking; min cues with pathfinding Gait velocity: approaching WNL    ADL: ADL Overall ADL's : Needs assistance/impaired Eating/Feeding: Independent, Sitting Grooming: Supervision/safety, Oral care, Standing Upper Body Bathing: Supervision/ safety, Standing Lower Body Bathing: Supervison/ safety, Sit to/from stand Upper Body Dressing : Supervision/safety, Sitting Lower Body Dressing: Sit to/from stand, Min guard Lower Body Dressing Details (indicate cue type and reason): donning his pants Toilet Transfer: Supervision/safety, Ambulation(simulated to recliner) Functional mobility during ADLs: Min guard, Supervision/safety General ADL Comments: Pt fatigues and reports a HA. Pt performing ADLs at supervision-Min Guard A level. Cognition being his main deficits.   Cognition: Cognition Overall Cognitive Status: Impaired/Different from baseline Orientation Level: Oriented X4 Cognition Arousal/Alertness: Awake/alert Behavior During Therapy: WFL for tasks assessed/performed Overall Cognitive Status: Impaired/Different from baseline Area of Impairment: Orientation, Memory, Awareness, Problem solving, Safety/judgement Orientation Level: Disoriented to, Place Memory: Decreased short-term memory Safety/Judgement: Decreased awareness of deficits Awareness: Emergent Problem Solving: Slow processing, Requires tactile cues General Comments: Pt presenting with decreased orientation to place, poor ST memory, and decreased problem solving. Pt unable to recall room number during trail making task as well as 3 ST memory words. Pt naming 1/3 animals that start with the letter "L" and then began naming cities. Discussing TBI symtpoms and pt presenting with increaing  awareness and able to state that he had difficulty with memory and finding his room. Pt performing simple money management question without difficulty.   Blood pressure 128/73, pulse (!) 51, temperature 98.1 F (36.7 C), temperature source Oral, resp. rate 10, weight 68.4 kg, SpO2 98 %. Physical Exam  Vitals reviewed. Constitutional: He appears well-developed and well-nourished.  HENT:  Head: Normocephalic and atraumatic.  Eyes: EOM are normal. Right eye exhibits no discharge. Left eye exhibits no discharge.  Neck: Normal range of motion. Neck supple.  Cardiovascular: Regular rhythm.  + Bradycardia  Respiratory: Effort normal and breath sounds normal.  GI: Soft. Bowel sounds are normal.  Musculoskeletal:     Comments: No edema or tenderness in extremities  Neurological: He is alert.  Oriented x1 Motor: 4+-5/5 throughout  Skin: Skin is warm and dry.  Psychiatric: His affect is blunt. He is slowed. Cognition and memory are impaired. He expresses impulsivity.    No results found for this or any previous visit (from the past 24 hour(s)). No results found.  Assessment/Plan: Diagnosis: TBI Labs and images (see above) independently reviewed.  Records reviewed and summated above.  [place] score:  >/VI  Speech to evaluate for Post traumatic amnesia and interval GOAT scores to assess progress.  NeuroPsych evaluation for behavorial assessment.  Provide environmental management by reducing the level of stimulation, tolerating restlessness when possible, protecting patient from harming self or others and reducing patient's cognitive confusion.  Address behavioral concerns include providing structured environments and daily routines.  Cognitive therapy to direct modular abilities in order to maintain goals  including problem solving, self regulation/monitoring, self management, attention, and memory.  Fall precautions; pt at risk for second impact syndrome  Prevention of secondary  injury: monitor for hypotension, hypoxia, seizures or signs of increased ICP  Prophylactic AED:   Consider pharmacological intervention if necessary with neurostimulants,  Such as amantadine, methylphenidate, modafinil, etc.  Consider Propranolol for agitation and storming  Avoid medications that could impair cognitive abilities, such as anticholinergics, antihistaminic, benzodiazapines, narcotics, etc when possible  1. Does the need for close, 24 hr/day medical supervision in concert with the patient's rehab needs make it unreasonable for this patient to be served in a less intensive setting? No  2. Co-Morbidities requiring supervision/potential complications: allergies, leukocytosis (repeat labs, cont to monitor for signs and symptoms of infection, further workup if indicated) 3. Due to safety, skin/wound care, disease management, medication administration, pain management and patient education, does the patient require 24 hr/day rehab nursing? Yes 4. Does the patient require coordinated care of a physician, rehab nurse, PT (1-2 hrs/day, 5 days/week), OT (1-2 hrs/day, 5 days/week) and SLP (1-2 hrs/day, 5 days/week) to address physical and functional deficits in the context of the above medical diagnosis(es)? No Addressing deficits in the following areas: locomotion, cognition, language and psychosocial support 5. Can the patient actively participate in an intensive therapy program of at least 3 hrs of therapy per day at least 5 days per week? Yes 6. The potential for patient to make measurable gains while on inpatient rehab is fair 7. Anticipated functional outcomes upon discharge from inpatient rehab are supervision  with PT, supervision with OT, supervision and min assist with SLP. 8. Estimated rehab length of stay to reach the above functional goals is: NA 9. Anticipated D/C setting: Home 10. Anticipated post D/C treatments: Outpatient therapy and Home excercise program 11. Overall  Rehab/Functional Prognosis: excellent and good  RECOMMENDATIONS: This patient's condition is appropriate for continued rehabilitative care in the following setting: Patient doing well functionally.  Recommended home with outpatient therapies.  Recommend PM&R outpatient follow up. Patient has agreed to participate in recommended program. Yes Note that insurance prior authorization may be required for reimbursement for recommended care.  Comment: Rehab Admissions Coordinator to follow up.   I have personally performed a face to face diagnostic evaluation, including, but not limited to relevant history and physical exam findings, of this patient and developed relevant assessment and plan.  Additionally, I have reviewed and concur with the physician assistant's documentation above.   Delice Lesch, MD, ABPMR Bary Leriche, PA-C 10/31/2018

## 2018-11-01 NOTE — Progress Notes (Signed)
Subjective: The patient is alert and pleasant.  His mother is at the bedside.  Objective: Vital signs in last 24 hours: Temp:  [97.9 F (36.6 C)-98.6 F (37 C)] 98.5 F (36.9 C) (01/21 0300) Pulse Rate:  [46-65] 51 (01/21 0450) Resp:  [10-26] 26 (01/21 0450) BP: (113-134)/(61-73) 113/67 (01/21 0450) SpO2:  [97 %-100 %] 97 % (01/21 0450) Weight:  [68.1 kg] 68.1 kg (01/21 0300) Estimated body mass index is 21.62 kg/m as calculated from the following:   Height as of 05/05/16: 5\' 11"  (1.803 m).   Weight as of 05/05/16: 70.3 kg.   Intake/Output from previous day: No intake/output data recorded. Intake/Output this shift: No intake/output data recorded.  Physical exam the patient is alert and oriented x2, person and a hospital.  His speech is normal.  His strength is normal.  Lab Results: No results for input(s): WBC, HGB, HCT, PLT in the last 72 hours. BMET No results for input(s): NA, K, CL, CO2, GLUCOSE, BUN, CREATININE, CALCIUM in the last 72 hours.  Studies/Results: No results found.  Assessment/Plan: Traumatic brain injury, cerebral contusions, skull fracture: The patient is slowly improving.  Rehab does not think he needs inpatient rehab.  We will plan to send him home tomorrow.  I have answered all the patient's, and his mother's, questions.  LOS: 4 days     Ophelia Charter 11/01/2018, 7:55 AM

## 2018-11-01 NOTE — Care Management Note (Signed)
Case Management Note  Patient Details  Name: Patrick Mclaughlin MRN: [id number] Date of Birth: Aug 29, 1996  Subjective/Objective:  Pt admitted on 10/28/18 s/p syncopal episode with brief LOC and mental status changes.  Pt fell, striking his head; CT showed skull fx and cerebral contusion.  PTA, pt independent, lives with parents when not in school.                  Action/Plan: PT/OT and ST recommending OP therapy at Neuro Rehab.  Family able to provide care at discharge.  Will make referrals to [facility] upon dc, possibly 11/02/18.    Expected Discharge Date:                  Expected Discharge Plan:  OP Rehab  In-House Referral:     Discharge planning Services  CM Consult  Post Acute Care Choice:    Choice offered to:     DME Arranged:    DME Agency:     HH Arranged:    HH Agency:     Status of Service:  In process, will continue to follow  If discussed at Long Length of Stay Meetings, dates discussed:    Additional Comments:  Reinaldo Raddle, RN, BSN  Trauma/Neuro ICU Case Manager [phone]

## 2018-11-01 NOTE — Progress Notes (Signed)
Occupational Therapy Treatment Patient Details Name: Patrick Mclaughlin MRN: [id number] DOB: [birth year] Today's Date: 11/01/2018    History of present illness The patient is a 23 year old white male who by report has had syncopal events with needles/blood, etc. in the past.  He has some injections and a primary doctor's office today and had a syncopal event falling and striking his head.  There was a brief loss of consciousness and mental status changes.  He was brought to [facility], ER where head scan was obtained.  This demonstrated a skull fracture and a cerebral contusion;  has a past medical history of Acne, Allergy, Concussion, and Seasonal allergies.   OT comments  Pt progressing slowly towards established OT goals. Upon arrival, pt sidelying in bed with mother at bedside and lights off (slight opening at window for natural light). Pt continues to perform BADLs at supervision level. However, quickly becomes overwhelmed by light, movement, and sound. Focused session in creating a plan for gradually increasing sensory input to maximize occupational performance and participation. Creating plan for OOB, increasing light, and increasing amount of food intake. Continue to recommend dc to home with follow Neuro OP OT. Will continue to follow acutely as admitted.    Follow Up Recommendations  Outpatient OT;Supervision/Assistance - 24 hour(Neuro OP OT)    Equipment Recommendations  None recommended by OT    Recommendations for Other Services      Precautions / Restrictions Precautions Precautions: Other (comment) Precaution Comments: TBI Restrictions Weight Bearing Restrictions: No       Mobility Bed Mobility Overal bed mobility: Needs Assistance Bed Mobility: Supine to Sit     Supine to sit: Supervision     General bed mobility comments: Supervision for safety and line management  Transfers Overall transfer level: Needs assistance Equipment used: None Transfers: Sit to/from  Stand Sit to Stand: Supervision         General transfer comment: supervision for safety    Balance Overall balance assessment: Needs assistance Sitting-balance support: Feet supported;No upper extremity supported Sitting balance-Leahy Scale: Good     Standing balance support: No upper extremity supported Standing balance-Leahy Scale: Good                             ADL either performed or assessed with clinical judgement   ADL Overall ADL's : Needs assistance/impaired Eating/Feeding: Independent;Sitting Eating/Feeding Details (indicate cue type and reason): Pt sitting in recliner to eat lunch. Discussing with pt and mother on how to grandually perform task to allow him to eat more and sit up for longer periods of time.  Grooming: Supervision/safety;Standing;Wash/dry hands Grooming Details (indicate cue type and reason): Pt performing hand hygiene at sink with supervision for safety. Requiring cues to "take your time" as pt performed tasks. Both since he tends to rush through a task as well as to reduce the sensory input and feeling of being overwhelmed back a simple task.                 Toilet Transfer: Sales executive;Ambulation           Functional mobility during ADLs: Supervision/safety General ADL Comments: Upon arrival, pt was sidelying in bed with mother at bedside and lights off and window shades lowered. Pt and mother reports he has not gotten OOB this AM. Focused session on creating a plan for grandually introducing sensatory input (including light, movement, sound, etc.) Creating a plan for introducing light and  getting OOB and monitoring pt's tolerance. Also talked about "body check ins" to notice HAs, dizziness, sense of overwhelmed, and feeling poorly. Pt performing toileting with supervision and then taking a rest break in the recliner. Pt then eating a little and taking a rest break. Recommended bringing sunglasses for light  sensivitiy.      Vision       Perception     Praxis      Cognition Arousal/Alertness: Awake/alert Behavior During Therapy: WFL for tasks assessed/performed Overall Cognitive Status: Impaired/Different from baseline Area of Impairment: Memory;Awareness;Problem solving;Safety/judgement                     Memory: Decreased short-term memory   Safety/Judgement: Decreased awareness of deficits Awareness: Emergent Problem Solving: Slow processing;Requires tactile cues General Comments: Focused session on grandual         Exercises     Shoulder Instructions       General Comments Mother present throughout.     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" OT Goal Formulation: With patient Time For Goal Achievement: 11/13/18 Potential to Achieve Goals: Good ADL Goals Additional ADL Goal #1: Pt will perform ADLs at Mod I level with increased time as needed Additional ADL Goal #2: Pt will perform four part trail making task with 1-2 cues Additional ADL Goal #3: Pt will demonstrate increased ST memory to perform three ADLs with 1-2 for recall  Plan Discharge plan remains appropriate    Co-evaluation                 AM-PAC OT "6 Clicks" Daily Activity     Outcome Measure   Help from another person eating meals?: None Help from another person taking care of personal  grooming?: None Help from another person toileting, which includes using toliet, bedpan, or urinal?: None Help from another person bathing (including washing, rinsing, drying)?: None Help from another person to put on and taking off regular upper body clothing?: None Help from another person to put on and taking off regular lower body clothing?: A Little 6 Click Score: 23    End of Session    OT Visit Diagnosis: Unsteadiness on feet (R26.81);Other abnormalities of gait and mobility (R26.89);Muscle weakness (generalized) (M62.81);Other symptoms and signs involving cognitive function;Pain Pain - Right/Left: (HA) Pain - part of body: (HA)   Activity Tolerance Patient tolerated treatment well;Other (comment)(Limited by HA)   Patient Left with call bell/phone within reach;with family/visitor present;in chair   Nurse Communication Mobility status        Time: 2706-2376 OT Time Calculation (min): 52 min  Charges: OT General Charges $OT Visit: 1 Visit OT Treatments $Self Care/Home Management : 38-52  mins  Dariane Natzke MSOT, OTR/L Acute Rehab Pager: [phone] Office: [place] 11/01/2018, 1:06 PM

## 2018-11-01 NOTE — Progress Notes (Addendum)
   Progress Note  Patient Name: Patrick Mclaughlin Date of Encounter: 11/01/2018  Primary Cardiologist:New  Subjective   Very mild headache   Light bothers eyes   NO CP   Breathing is OK  Inpatient Medications    Scheduled Meds: . famotidine  20 mg Oral BID   Continuous Infusions: . sodium chloride Stopped (10/28/18 2324)   PRN Meds: sodium chloride, acetaminophen, HYDROcodone-acetaminophen, ondansetron (ZOFRAN) IV   Vital Signs    Vitals:   11/01/18 0450 11/01/18 0800 11/01/18 0900 11/01/18 1233  BP: 113/67  109/67 124/77  Pulse: (!) 51 (!) 46 60 (!) 53  Resp: (!) 26 13 (!) 22 12  Temp:   98.2 F (36.8 C) 98.1 F (36.7 C)  TempSrc:   Oral Oral  SpO2: 97% 97% 97% 100%  Weight:       No intake or output data in the 24 hours ending 11/01/18 1329 Last 3 Weights 11/01/2018 10/29/2018 05/05/2016  Weight (lbs) 150 lb 2.1 oz 150 lb 12.7 oz 155 lb  Weight (kg) 68.1 kg 68.4 kg 70.308 kg      Telemetry    SR   Personally Reviewed  ECG    Tele:   AB to SR  38 to 70  Physical Exam   GEN: Thin 23 yo in no acute distress.   Neck: No JVD Cardiac: RRR, no murmurs, rubs, or gallops.  Respiratory: Clear to auscultation bilaterally. GI: Soft, nontender, non-distended  MS: No edema; No deformity. Neuro:  Nonfocal  Psych: Normal affect   Labs    Chemistry Recent Labs  Lab 10/28/18 1213  NA 137  K 4.2  CL 103  CO2 24  GLUCOSE 155*  BUN 18  CREATININE 1.03  CALCIUM 9.2  GFRNONAA >60  GFRAA >60  ANIONGAP 10     Hematology Recent Labs  Lab 10/28/18 1213  WBC 17.2*  RBC 4.96  HGB 14.4  HCT 42.1  MCV 84.9  MCH 29.0  MCHC 34.2  RDW 11.9  PLT 251    Cardiac EnzymesNo results for input(s): TROPONINI in the last 168 hours. No results for input(s): TROPIPOC in the last 168 hours.   BNPNo results for input(s): BNP, PROBNP in the last 168 hours.   DDimer No results for input(s): DDIMER in the last 168 hours.   Radiology    No results found.  Cardiac  Studies     Patient Profile     23 y.o. male with history of syncope  Admitted after closed head injury from fall  Assessment & Plan     1   Syncope  Hx Vasovagal presyncope and syncope  Speaking with mom and pt it is important that he stay hydrated, add in extra salt   With dizziness sit down until feels better  Would check orthostatics today    Pt may be unsteady because of TBI    WOuld ambulate later to follow HR and BP and symptoms May need home aid to make sure doesn't fall  Dorris Carnes  For questions or updates, please contact [place] Please consult [email or web address] for contact info under        Signed, Dorris Carnes, MD  11/01/2018, 1:29 PM

## 2018-11-02 ENCOUNTER — Encounter (HOSPITAL_COMMUNITY): Payer: Self-pay | Admitting: *Deleted

## 2018-11-02 LAB — URINALYSIS, ROUTINE W REFLEX MICROSCOPIC
Bilirubin Urine: NEGATIVE
GLUCOSE, UA: NEGATIVE mg/dL
Hgb urine dipstick: NEGATIVE
KETONES UR: 20 mg/dL — AB
Leukocytes, UA: NEGATIVE
Nitrite: NEGATIVE
PH: 6 (ref 5.0–8.0)
Protein, ur: NEGATIVE mg/dL
Specific Gravity, Urine: 1.02 (ref 1.005–1.030)

## 2018-11-02 MED ORDER — SODIUM CHLORIDE 0.9 % IV SOLN
INTRAVENOUS | Status: DC
  Administered 2018-11-02: 500 mL/h via INTRAVENOUS
  Administered 2018-11-02 (×3): via INTRAVENOUS

## 2018-11-02 MED ORDER — MELATONIN 3 MG PO TABS
3.0000 mg | ORAL_TABLET | Freq: Every evening | ORAL | Status: DC | PRN
  Administered 2018-11-02: 3 mg via ORAL
  Filled 2018-11-02 (×2): qty 1

## 2018-11-02 NOTE — Progress Notes (Signed)
Patient very anxious as IV required replacement for IVF.  Patient very ambivalent and stated he could "drink the fluids" instead of having the IV.  Reviewed relaxation techniques during IV insertion, which appeared to assist patient.  He tends to hyperventilate and has unrealistic expectations re: his recovery.  Parents want him to stay at home and to have an alarm under his mattress to alert them when he gets OOB.  Education provided for family and for patient re:  TBI.

## 2018-11-02 NOTE — Progress Notes (Signed)
Subjective: The patient is alert.  He is a bit irritable.  His father is at the bedside.  Objective: Vital signs in last 24 hours: Temp:  [98.1 F (36.7 C)-99.4 F (37.4 C)] 99.4 F (37.4 C) (01/22 1509) Pulse Rate:  [56-133] 104 (01/22 1524) Resp:  [13-25] 22 (01/22 1524) BP: (101-135)/(52-99) 121/99 (01/22 1524) SpO2:  [92 %-100 %] 95 % (01/22 1524) Estimated body mass index is 21.62 kg/m as calculated from the following:   Height as of 05/05/16: 5\' 11"  (1.803 m).   Weight as of 05/05/16: 70.3 kg.   Intake/Output from previous day: No intake/output data recorded. Intake/Output this shift: Total I/O In: 620 [P.O.:620] Out: 1150 [Urine:1150]  Physical exam the patient is alert.  Speech is normal.  He is moving all 4 extremities.  Lab Results: No results for input(s): WBC, HGB, HCT, PLT in the last 72 hours. BMET No results for input(s): NA, K, CL, CO2, GLUCOSE, BUN, CREATININE, CALCIUM in the last 72 hours.  Studies/Results: No results found.  Assessment/Plan: Skull fracture, cerebral contusions, traumatic brain injury: The patient is slowly improving.  I encouraged him to increase his p.o. intake.  I spoke with his parents.  We are going to plan to send him home tomorrow morning.  Orthostatic hypotension: I appreciate Dr. Alan Ripper help with this.  LOS: 5 days     Ophelia Charter 11/02/2018, 4:21 PM

## 2018-11-02 NOTE — Progress Notes (Signed)
Occupational Therapy Treatment Patient Details Name: Patrick Mclaughlin MRN: [id number] DOB: 08-02-1996 Today's Date: 11/02/2018    History of present illness The patient is a 23 year old white male who by report has had syncopal events with needles/blood, etc. in the past.  He has some injections and a primary doctor's office today and had a syncopal event falling and striking his head.  There was a brief loss of consciousness and mental status changes.  He was brought to [facility], ER where head scan was obtained.  This demonstrated a skull fracture and a cerebral contusion;  has a past medical history of Acne, Allergy, Concussion, and Seasonal allergies.   OT comments  Pt with increased irritation and anxiety this session. Pt performing toileting and hand hygiene at supervision level and requiring cues for sequencing, safety, and taking his time. Pt perseverating on topic of IV and how he "wants it out now." Discussed with Dad on designing a plan for sensory input, creating routine, and traveling to/from OP therapy. Continue to recommend dc to Neuro OP and will continue to follow acutely as admitted.     Follow Up Recommendations  Outpatient OT;Supervision/Assistance - 24 hour(Neuro OP OT)    Equipment Recommendations  None recommended by OT    Recommendations for Other Services      Precautions / Restrictions Precautions Precautions: Other (comment) Precaution Comments: TBI       Mobility Bed Mobility Overal bed mobility: Needs Assistance Bed Mobility: Supine to Sit     Supine to sit: Supervision     General bed mobility comments: Supervision for safety and line management  Transfers Overall transfer level: Needs assistance Equipment used: None Transfers: Sit to/from Stand Sit to Stand: Supervision         General transfer comment: supervision for safety; gentle cueing to slow down and swlf-monitor    Balance Overall balance assessment: Needs  assistance Sitting-balance support: Feet supported;No upper extremity supported Sitting balance-Leahy Scale: Good     Standing balance support: No upper extremity supported Standing balance-Leahy Scale: Good                             ADL either performed or assessed with clinical judgement   ADL Overall ADL's : Needs assistance/impaired     Grooming: Supervision/safety;Standing;Wash/dry hands Grooming Details (indicate cue type and reason): Performing hand hygiene. Reports that subglasses are beneficial with increased lights in bathroom                 Toilet Transfer: Supervision/safety;Regular Toilet;Ambulation Toilet Transfer Details (indicate cue type and reason): supervision for safety         Functional mobility during ADLs: Supervision/safety General ADL Comments: Upon arrival, pt in bed and reports that he is feeling poorly because he had to stand to take his BP. Pt presenting with increased irritation this session compared to prior session. Discussed with dad about plans for home and how to travel to/from OP therapy. Including use of sunglasses, routine, increasing stimulation, etc.      Vision       Perception     Praxis      Cognition Arousal/Alertness: Awake/alert Behavior During Therapy: Restless;Anxious Overall Cognitive Status: Impaired/Different from baseline Area of Impairment: Memory;Awareness;Problem solving;Safety/judgement                     Memory: Decreased short-term memory   Safety/Judgement: Decreased awareness of deficits Awareness: Emergent Problem Solving:  Slow processing;Requires tactile cues General Comments: Pt presenting with increased irritation and anxiety this session. Perseverating on topic of IV and stating he feels like his arm is not connected to his body. Pt also presenting with outbursts about hip pain while standing. Also presenting with more impulsivity.         Exercises     Shoulder  Instructions       General Comments Father present during session    Pertinent Vitals/ Pain       Pain Assessment: Faces Faces Pain Scale: Hurts a little bit Pain Location: Headache, and bil hip tightness Pain Descriptors / Indicators: Headache;Tightness Pain Intervention(s): Monitored during session;Limited activity within patient's tolerance;Repositioned  Home Living                                          Prior Functioning/Environment              Frequency  Min 4X/week        Progress Toward Goals  OT Goals(current goals can now be found in the care plan section)  Progress towards OT goals: Not progressing toward goals - comment(Increased irritation and anxiety this session)  Acute Rehab OT Goals Patient Stated Goal: His IV is bothering him today OT Goal Formulation: With patient Time For Goal Achievement: 11/13/18 Potential to Achieve Goals: Good ADL Goals Additional ADL Goal #1: Pt will perform ADLs at Mod I level with increased time as needed Additional ADL Goal #2: Pt will perform four part trail making task with 1-2 cues Additional ADL Goal #3: Pt will demonstrate increased ST memory to perform three ADLs with 1-2 for recall  Plan Discharge plan remains appropriate    Co-evaluation                 AM-PAC OT "6 Clicks" Daily Activity     Outcome Measure   Help from another person eating meals?: None Help from another person taking care of personal grooming?: None Help from another person toileting, which includes using toliet, bedpan, or urinal?: None Help from another person bathing (including washing, rinsing, drying)?: None Help from another person to put on and taking off regular upper body clothing?: None Help from another person to put on and taking off regular lower body clothing?: A Little 6 Click Score: 23    End of Session    OT Visit Diagnosis: Unsteadiness on feet (R26.81);Other abnormalities of gait and  mobility (R26.89);Muscle weakness (generalized) (M62.81);Other symptoms and signs involving cognitive function;Pain   Activity Tolerance Treatment limited secondary to agitation   Patient Left in bed;with call bell/phone within reach   Nurse Communication Mobility status        Time: 7741-4239 OT Time Calculation (min): 38 min  Charges: OT General Charges $OT Visit: 1 Visit OT Treatments $Self Care/Home Management : 38-52 mins  [place], OTR/L Acute Rehab Pager: [phone] Office: [place] 11/02/2018, 5:28 PM

## 2018-11-02 NOTE — Progress Notes (Signed)
Physical Therapy Treatment Patient Details Name: Patrick Mclaughlin MRN: [id number] DOB: November 12, 1995 Today's Date: 11/02/2018    History of Present Illness The patient is a 23 year old white male who by report has had syncopal events with needles/blood, etc. in the past.  He has some injections and a primary doctor's office today and had a syncopal event falling and striking his head.  There was a brief loss of consciousness and mental status changes.  He was brought to [facility], ER where head scan was obtained.  This demonstrated a skull fracture and a cerebral contusion;  has a past medical history of Acne, Allergy, Concussion, and Seasonal allergies.    PT Comments    Continuing work on functional mobility and activity tolerance; Patrick Mclaughlin was more restless and anxious this session, reporting he was nervous about presence of IV pole; Patrick Mclaughlin's agitation ramped up enough as we planned our session that it became apparent that a flight of stairs or the DGI would be too much stimulation for Patrick Mclaughlin today; I considered updating the dc plan to getting HHPT in the home to help transition Patrick Mclaughlin to home, given he (and Patrick Mclaughlin) were so nervous about walking and managing at home -- but ultimately, I believe Outpt Therapies (OT/ST/and PT to a lesser extent) are the best option for Patrick Mclaughlin;   I encouraged OOB for every meal; Pt's mother is so nervous about managing at home; I did express to Patrick Mclaughlin that I believe the stairs he must go up and down to get to his bedroom will be fine -- especially because they are familiar to him, in his familiar, comfortable environment  Follow Up Recommendations  Outpatient PT(at a neuro rehabilitation center)     Equipment Recommendations  None recommended by PT    Recommendations for Other Services       Precautions / Restrictions Precautions Precautions: Other (comment) Precaution Comments: TBI    Mobility  Bed Mobility Overal bed mobility: Needs Assistance Bed Mobility: Supine to  Sit     Supine to sit: Supervision     General bed mobility comments: Supervision for safety and line management  Transfers Overall transfer level: Needs assistance Equipment used: None Transfers: Sit to/from Stand Sit to Stand: Supervision         General transfer comment: supervision for safety; gentle cueing to slow down and swlf-monitor  Ambulation/Gait Ambulation/Gait assistance: Min guard;Supervision Gait Distance (Feet): 600 Feet Assistive device: None;IV Pole Gait Pattern/deviations: Step-through pattern Gait velocity: WNL   General Gait Details: Cues to self-monitor for activity tolerance; anxious at first re: IV, but halfway through walk he verbalized that he was getting more comfortable with IV pole, and that he preferred to push the pole; required one reminder for short-term memory challenge; incr time to pathfind back to room once back on unit, but no cues needed   Stairs             Wheelchair Mobility    Modified Rankin (Stroke Patients Only)       Balance     Sitting balance-Leahy Scale: Good       Standing balance-Leahy Scale: Good                              Cognition Arousal/Alertness: Awake/alert Behavior During Therapy: Restless;Anxious Overall Cognitive Status: Impaired/Different from baseline Area of Impairment: Memory;Awareness;Problem solving;Safety/judgement  Memory: Decreased short-term memory   Safety/Judgement: Decreased awareness of deficits Awareness: Emergent Problem Solving: Slow processing;Requires tactile cues General Comments: Anxious and restless re: IV in R UE; Took more gentle encouragement and education that it is OK to walk with IV pole; hypersensitive to touch; More restless, higher energy today, it baecame apparent quickly that flight of stairs and/or DGI would be too much stimulation      Exercises      General Comments General comments (skin integrity, edema,  etc.): Initial HR at rest in bed 57; HR incr to 115 at initial stand; ranged 109 to 124 with flat-level amb; HR 78 end of session in recliner      Pertinent Vitals/Pain Pain Assessment: Faces Faces Pain Scale: Hurts a little bit Pain Location: Headache, and bil hip tightness Pain Descriptors / Indicators: Headache;Tightness Pain Intervention(s): Monitored during session    Home Living                      Prior Function            PT Goals (current goals can now be found in the care plan section) Acute Rehab PT Goals Patient Stated Goal: His IV is bothering him today PT Goal Formulation: With patient Time For Goal Achievement: 11/13/18 Potential to Achieve Goals: Good Progress towards PT goals: Not progressing toward goals - comment(limited by restlessness/anxiety this session)    Frequency    Min 3X/week      PT Plan Current plan remains appropriate    Co-evaluation              AM-PAC PT "6 Clicks" Mobility   Outcome Measure  Help needed turning from your back to your side while in a flat bed without using bedrails?: None Help needed moving from lying on your back to sitting on the side of a flat bed without using bedrails?: None Help needed moving to and from a bed to a chair (including a wheelchair)?: None Help needed standing up from a chair using your arms (e.g., wheelchair or bedside chair)?: None Help needed to walk in hospital room?: None Help needed climbing 3-5 steps with a railing? : A Little 6 Click Score: 23    End of Session Equipment Utilized During Treatment: Gait belt Activity Tolerance: Patient tolerated treatment well Patient left: in chair;with call bell/phone within reach;with family/visitor present Nurse Communication: Mobility status PT Visit Diagnosis: Other abnormalities of gait and mobility (R26.89);Other symptoms and signs involving the nervous system (R29.898)     Time: 1035-1100 PT Time Calculation (min) (ACUTE  ONLY): 25 min  Charges:  $Gait Training: 23-37 mins                     Roney Marion, [place]  [place] Pager [phone] Office [place] 11/02/2018, 2:59 PM

## 2018-11-02 NOTE — Progress Notes (Signed)
Patient received 4 L of IV normal saline  He has urinated about 1.1 L  Orthostatics   Laying:             135/77   P 58    Sitting                117/97  P 86 Standing             110/73  P 122 Standing at 4 min: 121/99   P 104  Pt has evid of autonomic dysfunction with orthostatic intolerance even after hydration This is probably all related to his brain injury and should improve with time  Recomm:   Ample fluids   At least a few liters per day    INcreased salt  At least 4 grams per day  Can try elevating head of bed  Needs to get up with assistance   He is at increased risk for syncope and falling until this resolves  I'll make sure he has f/u in clinic   [employer]

## 2018-11-02 NOTE — Progress Notes (Signed)
Progress Note  Patient Name: Patrick Mclaughlin Date of Encounter: 11/02/2018  Primary Cardiologist:New  Subjective   Mild HA    ANxious because IV had to be replaced Mom said son confused last night   Tried to get out of bed    Inpatient Medications    Scheduled Meds: . famotidine  20 mg Oral BID   Continuous Infusions: . sodium chloride Stopped (10/28/18 2324)  . sodium chloride 500 mL/hr (11/02/18 0931)   PRN Meds: sodium chloride, acetaminophen, HYDROcodone-acetaminophen, Melatonin, ondansetron (ZOFRAN) IV   Vital Signs    Vitals:   11/01/18 1900 11/01/18 2000 11/02/18 0000 11/02/18 0400  BP:  117/89 (!) 110/52 103/60  Pulse: 99     Resp: 16  13   Temp:  98.6 F (37 C) 98.1 F (36.7 C) 98.1 F (36.7 C)  TempSrc:  Oral Oral Oral  SpO2: 100%  100% 97%  Weight:       No intake or output data in the 24 hours ending 11/02/18 0947 Last 3 Weights 11/01/2018 10/29/2018 05/05/2016  Weight (lbs) 150 lb 2.1 oz 150 lb 12.7 oz 155 lb  Weight (kg) 68.1 kg 68.4 kg 70.308 kg      Telemetry    SB to SR   40 to 130s     Personally Reviewed  ECG      Physical Exam   GEN: Thin 23 yo in no acute distress.   Neck: No JVD Cardiac: RRR, no murmurs, rubs, or gallops.  Respiratory: Clear to auscultation bilaterally. GI: Soft, nontender, non-distended  MS: No edema; No deformity. Neuro:  Nonfocal  Psych: Normal affect   Labs    Chemistry Recent Labs  Lab 10/28/18 1213  NA 137  K 4.2  CL 103  CO2 24  GLUCOSE 155*  BUN 18  CREATININE 1.03  CALCIUM 9.2  GFRNONAA >60  GFRAA >60  ANIONGAP 10     Hematology Recent Labs  Lab 10/28/18 1213  WBC 17.2*  RBC 4.96  HGB 14.4  HCT 42.1  MCV 84.9  MCH 29.0  MCHC 34.2  RDW 11.9  PLT 251    Cardiac EnzymesNo results for input(s): TROPONINI in the last 168 hours. No results for input(s): TROPIPOC in the last 168 hours.   BNPNo results for input(s): BNP, PROBNP in the last 168 hours.   DDimer No results for  input(s): DDIMER in the last 168 hours.   Radiology    No results found.  Cardiac Studies     Patient Profile     23 y.o. male with history of  Vasovagal syncope  Admitted after closed head injury after syncopal spell   Assessment & Plan     1   Syncope Orthostatics yesterday were incomplete but laying to sitting  BP / P Laying :   110/60  P 53   Sitting    130/78   P 100.  Standing  Measurements not done WIth walking HR increased ot 130s later   Have asked for UA to check SG     WOuld give 1 L NS and then f/u BP and HR      Spoke with pt and family re increasing salt and fluid intake when goes home    BP regulation may be off post injury   Need to fluid/salt load to minimze factors that could lead to another event    Dorris Carnes  For questions or updates, please contact [place] Please consult [email or web address] for  contact info under        Signed, Dorris Carnes, MD  11/02/2018, 9:47 AM

## 2018-11-03 ENCOUNTER — Other Ambulatory Visit: Payer: Self-pay | Admitting: *Deleted

## 2018-11-03 ENCOUNTER — Encounter (HOSPITAL_COMMUNITY): Payer: Self-pay | Admitting: *Deleted

## 2018-11-03 DIAGNOSIS — I609 Nontraumatic subarachnoid hemorrhage, unspecified: Secondary | ICD-10-CM

## 2018-11-03 MED ORDER — HYDROCODONE-ACETAMINOPHEN 5-325 MG PO TABS: 1.0000 | ORAL_TABLET | Freq: Four times a day (QID) | ORAL | 0 refills | Status: AC | PRN

## 2018-11-03 MED FILL — HYDROCODON-APAP 5-325: 5-325 | 5 days supply | Qty: 40 | Fill #0

## 2018-11-03 NOTE — Progress Notes (Signed)
IV removed, discharge instructions reviewed with patient with mother and father present.  Patient being discharged home in the care of his parents.

## 2018-11-03 NOTE — Care Management Note (Signed)
Case Management Note  Patient Details  Name: Patrick Mclaughlin MRN: [id number] Date of Birth: 09/22/1996  Subjective/Objective:  Pt admitted on 10/28/18 s/p syncopal episode with brief LOC and mental status changes.  Pt fell, striking his head; CT showed skull fx and cerebral contusion.  PTA, pt independent, lives with parents when not in school.                  Action/Plan: PT/OT and ST recommending OP therapy at Neuro Rehab.  Family able to provide care at discharge.  Will make referrals to [facility] upon dc, possibly 11/02/18.    Expected Discharge Date:  11/03/18               Expected Discharge Plan:  OP Rehab  In-House Referral:     Discharge planning Services  CM Consult  Post Acute Care Choice:    Choice offered to:     DME Arranged:    DME Agency:     HH Arranged:    HH Agency:     Status of Service:  Completed, signed off  If discussed at [employer] of Stay Meetings, dates discussed:    Additional Comments:  11/03/18 J. Sulayman Manning, RN, BSN Pt medically stable for discharge home today with parents.  Referrals made to [facility] for PT/OT and ST.  Parents able to provide supervision at dc and transportation to OP rehab.  Reinaldo Raddle, RN, BSN  Trauma/Neuro ICU Case Manager [phone]

## 2018-11-03 NOTE — Discharge Summary (Signed)
Physician Discharge Summary  Patient ID: Patrick Mclaughlin MRN: [id number] DOB/AGE: May 22, 1996 23 y.o.  Admit date: 10/28/2018 Discharge date: 11/03/2018  Admission Diagnoses:  Cerebral contusion and laceration with loss of consciousness ([place]), SAH (subarachnoid hemorrhage) ([place]), Leukocytosis  Discharge Diagnoses:  Active Problems:   Cerebral contusion and laceration with loss of consciousness (HCC)   SAH (subarachnoid hemorrhage) (H[facility])   Leukocytosis   Discharged Condition: good  Hospital Course: Patient was admitted on 10/28/2018 due to a syncopal episode at his primary care office. There was a brief loss of consciousness and mental status changes. He was brought to the [facility] ED, where a CT scan demonstrated a skull fracture and cerebral contusion. He was noted to have significant bradycardia with small pauses. Cardiology was consulted given his history of syncopal episodes. He will follow up with them as an outpatient. He worked with physical therapy and occupational therapy. A plan was made for continued therapies post discharge. Recommended to patient and parents to follow up with PCP regarding thyroid nodule.  Consults: cardiology  Significant Diagnostic Studies: radiology: Sagittally oriented midline fracture through the occipital bone extending to the level of the foramen magnum. Small amount of left frontal hemorrhage. This appears to be both a very small left frontal lobe cortical hemorrhagic contusion, and some adjacent subarachnoid hemorrhage, as detailed above. No evidence of significant acute traumatic injury to the cervical spine. 1.3 x 1.0 cm partially calcified nodule in the left lobe of the thyroid gland. Further evaluation with nonemergent thyroid ultrasound is recommended in the near future to better evaluate this finding and determine if there is a need to further evaluate this lesion with fine-needle aspiration.   Treatments: therapies: PT and OT  Discharge  Exam: Blood pressure 128/79, pulse 76, temperature 98.3 F (36.8 C), temperature source Oral, resp. rate 18, weight 69.2 kg, SpO2 100 %. General appearance: alert and cooperative Head: Normocephalic, without obvious abnormality, atraumatic Neurologic: Grossly normal  Disposition: Discharge disposition: 01-Home or Self Care       Discharge Instructions    Ambulatory referral to Physical Medicine Rehab   Complete by:  As directed    1 month post hospital TBI follow-up     Allergies as of 11/03/2018   No Known Allergies     Medication List    TAKE these medications   fexofenadine 180 MG tablet Commonly known as:  ALLEGRA Take 180 mg by mouth as needed for allergies or rhinitis.   HYDROcodone-acetaminophen 5-325 MG tablet Commonly known as:  NORCO/VICODIN Take 1-2 tablets by mouth every 6 (six) hours as needed for moderate pain or severe pain. What changed:  reasons to take this      Follow-up Information    [facility] Follow up.   Specialty:  Rehabilitation Why:  Rehab center will call you for therapy appointments. (physical, occupational and speech therapies.) Contact information: [address] [place] [phone]       Newman Pies, MD Follow up in 1 month(s).   Specialty:  Neurosurgery Why:  Call to make an appointment Contact information: [address] [place] [address] [phone]        Fay Records, MD. Schedule an appointment as soon as possible for a visit.   Specialty:  Cardiology Contact information: [address] [place] [address] [place] [address] [phone]           Signed: Patricia Nettle 11/03/2018, 11:20 AM

## 2018-11-03 NOTE — Progress Notes (Signed)
   Progress Note  Patient Name: Patrick Mclaughlin Date of Encounter: 11/03/2018  Pt sleeping    I spoke to his dad who said he had a better night   Watched TV   Lights not as bothersome    Slept last night without waking up confused Plans for D/C today    Reviewed fluids/salt; safety measures to avoid falls. Will set patient to be seen in clinic for f/u to see how recovering  Dorris Carnes, MD

## 2018-11-04 ENCOUNTER — Telehealth: Payer: Self-pay | Admitting: Internal Medicine

## 2018-11-04 ENCOUNTER — Other Ambulatory Visit: Payer: Self-pay | Admitting: *Deleted

## 2018-11-04 NOTE — Telephone Encounter (Signed)
New message     Pt dad stated that his son was seen in hospital by dr Harrington Challenger and wants to make sure its ok to see another MD due to DR. Ross not having anything avail anytime soon .

## 2018-11-04 NOTE — Telephone Encounter (Signed)
I need to see him   May need to overbook him or set another day Will discuss with [employer]

## 2018-11-04 NOTE — Telephone Encounter (Signed)
Closed accidentally

## 2018-11-04 NOTE — Patient Outreach (Addendum)
[facility]) Care Management  11/04/2018  Patrick Mclaughlin Oct 22, 1995 [id number]   Transition of care telephone call:    Subjective: Initial successful telephone call to patient's preferred number in order to complete transition of care assessment; 2 HIPAA identifiers verified. Explained purpose of call and completed transition of care assessment. After speaking with Patrick Mclaughlin, obtained verbal permission to speak with his Mom, Patrick Mclaughlin. Patrick Mclaughlin states Patrick Mclaughlin's mentation is much better since coming home from the hospital and that he wants to do more than he probably should. She says he does state he is slightly dizzy at times when changing positions. Patrick Mclaughlin denies any other problems Patrick Mclaughlin is having. She says she is aware of changes in Patrick Mclaughlin's condition that would require immediate MD notification.   Patrick Mclaughlin says Patrick Mclaughlin's first appointment with the Patrick Mclaughlin is Wed 1/29.     Objective:  Patrick Mclaughlin was hospitalized at [facility] from 1/17-1/23/20 for a head injury after a syncopal episode at his primary care providers office. Patrick Mclaughlin was discharged to home on 11/03/18 with outpatient rehab for PT/OT/ST at the Patrick Mclaughlin.     Assessment:  See transition of care flowsheet for assessment details.   Plan:  Reviewed fall precautions with Mom and ensured follow up with all providers (neurosurgeon, cardiologist- related to bradycardic episodes while hospitalized), neuro rehab and eventual need to see Patrick Mclaughlin's primary care provider for left thyroid lobe nodule.  No ongoing care management needs identified so will close case to [facility] Management care management services and route successful outreach letter with [facility] Management pamphlet and 24 Hour Nurse Line Magnet to [facility] clinical pool to be mailed to patient's home address.   Barrington Ellison RN,CCM,CDE [place] [facility] Management Coordinator

## 2018-11-07 NOTE — Telephone Encounter (Signed)
Spoke with patient's father who asked that I speak with patient's mother to confirm appt date/time. I called patient's mother, Patrick Mclaughlin.  She will bring patient to appointment next Monday 11/14/18 at 2:20 pm with Dr. Harrington Challenger.

## 2018-11-09 ENCOUNTER — Ambulatory Visit: Payer: 59 | Admitting: Physical Therapy

## 2018-11-09 ENCOUNTER — Other Ambulatory Visit: Payer: Self-pay

## 2018-11-09 ENCOUNTER — Ambulatory Visit: Payer: 59 | Attending: Neurosurgery

## 2018-11-09 ENCOUNTER — Ambulatory Visit: Payer: 59 | Admitting: Occupational Therapy

## 2018-11-09 DIAGNOSIS — R41840 Attention and concentration deficit: Secondary | ICD-10-CM | POA: Diagnosis not present

## 2018-11-09 DIAGNOSIS — R42 Dizziness and giddiness: Secondary | ICD-10-CM | POA: Insufficient documentation

## 2018-11-09 DIAGNOSIS — R29818 Other symptoms and signs involving the nervous system: Secondary | ICD-10-CM | POA: Insufficient documentation

## 2018-11-09 DIAGNOSIS — R41841 Cognitive communication deficit: Secondary | ICD-10-CM | POA: Diagnosis not present

## 2018-11-09 DIAGNOSIS — R41844 Frontal lobe and executive function deficit: Secondary | ICD-10-CM | POA: Insufficient documentation

## 2018-11-09 NOTE — Patient Instructions (Signed)
Constant Therapy - app  Other strategy games or apps  Work in sequences of 20 minutes or so, and then a brain break of 5-10 minutes

## 2018-11-09 NOTE — Therapy (Signed)
[facility] [address] [place], [place], [address] Phone: [phone]   Fax:  [phone]  Speech Language Pathology Evaluation  Patient Details  Name: Patrick Mclaughlin MRN: [id number] Date of Birth: May 13, 1996 Referring Provider (SLP): [name] MD   Encounter Date: 11/09/2018  End of Session - 11/09/18 1641    Visit Number  1    Number of Visits  1    Date for SLP Re-Evaluation  11/09/18    SLP Start Time  68    SLP Stop Time   1400    SLP Time Calculation (min)  42 min    Activity Tolerance  Patient tolerated treatment well       Past Medical History:  Diagnosis Date  . Acne   . Allergy    seasonal allergies  . Concussion   . Seasonal allergies     Past Surgical History:  Procedure Laterality Date  . NASAL SEPTOPLASTY W/ TURBINOPLASTY Bilateral 05/05/2016   Procedure: NASAL SEPTOPLASTY WITH TURBINATE REDUCTION;  Surgeon: Rozetta Nunnery, MD;  Location: [facility];  Service: ENT;  Laterality: Bilateral;    There were no vitals filed for this visit.      SLP Evaluation [facility] - 11/09/18 1316      SLP Visit Information   SLP Received On  11/09/18    Referring Provider (SLP)  [name] MD    Onset Date  10-28-18    Medical Diagnosis  TBI      Subjective   Subjective  Pt reports a difference, maybe slight, with attention. "I'm not sure if it's just psychosomatic or if it's real."      General Information   HPI  Pt with TBI from fall due to syncopal episode at MD office on 10-18-18. Had acute PT and OT services - now arrives today for ST eval. 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       Balance Screen   Has the patient fallen in the past 6 months  Yes    How many times?  1      Prior Functional Status   Cognitive/Linguistic Baseline  Within functional limits    Type of Home  House      Lives With  Family    Available Support  Family    Education  --   Beginning co-op in [place] - with a Environmental manager      Pain Assessment   Pain Assessment  0-10      Cognition   Overall Cognitive Status  Within Functional Limits for tasks assessed    Attention  --   reported attention waned in a fucntional task x1   Behaviors  Impulsive   mild/personality?     Auditory Comprehension   Overall Auditory Comprehension  Appears within functional limits for tasks assessed      Verbal Expression   Overall Verbal Expression  Appears within functional limits for tasks assessed      Oral Motor/Sensory Function   Overall Oral Motor/Sensory Function  Appears within functional limits for tasks assessed      Motor Speech   Overall Motor Speech  Appears within functional limits for tasks assessed      Standardized Assessments   Standardized Assessments   Cognitive Linguistic Quick Test  (CLQT)  Cognitive Linguistic Quick Test (Ages 18-69)   Attention  WNL   PT SCORED WNL    Memory  WNL    Executive Function  WNL    Language  WNL    Visuospatial Skills  WNL    Severity Rating Total  20    Composite Severity Rating  16.8                      SLP Education - 11/09/18 1640    Education Details  results of eval, cognitive linguistic tasks to do at home, need for brain breaks    Person(s) Educated  Patient;Parent(s)    Methods  Explanation    Comprehension  Verbalized understanding           Plan - 11/09/18 1641    Clinical Impression Statement  Pt presents with WNL cognitive linguistics as measured by CLQT, see flowsheet for domain scores. Pt reported decr'd attention completing a functional task. This SLP feels pt's cognitive linguisitcs are at a place too high for therapy but maybe not at pt's baseline; SLP suggested home tasks for pt. At this time, OT will pick up pt for therapy, however it is possible for pt to receive cognitive  linguistic therapy if OT therapy reveals more in-depth cognitive defictis with a language basis. Ciurrently, pt will be evaluation only. Lastly, neuropsych services may want to be pursued to get a more definitive picture of pt's cognitive deficits.    Speech Therapy Frequency  One time visit    Consulted and Agree with Plan of Care  Patient       Patient will benefit from skilled therapeutic intervention in order to improve the following deficits and impairments:   Cognitive communication deficit    Problem List Patient Active Problem List   Diagnosis Date Noted  . SAH (subarachnoid hemorrhage) ([facility])   . Leukocytosis   . Cerebral contusion and laceration with loss of consciousness ([place]) 10/28/2018    [place] ,[place], [place]  11/09/2018, 4:58 PM  [facility] [address] [place], [place], [address] Phone: [phone]   Fax:  [phone]  Name: Patrick Mclaughlin MRN: [id number] Date of Birth: Oct 07, 1996

## 2018-11-11 ENCOUNTER — Encounter: Payer: Self-pay | Admitting: Physical Therapy

## 2018-11-11 ENCOUNTER — Ambulatory Visit: Payer: 59 | Admitting: Cardiology

## 2018-11-11 ENCOUNTER — Ambulatory Visit: Payer: 59 | Admitting: Physical Therapy

## 2018-11-11 VITALS — BP 121/84 | HR 84

## 2018-11-11 DIAGNOSIS — R29818 Other symptoms and signs involving the nervous system: Secondary | ICD-10-CM | POA: Diagnosis not present

## 2018-11-11 DIAGNOSIS — R42 Dizziness and giddiness: Secondary | ICD-10-CM

## 2018-11-11 DIAGNOSIS — R41841 Cognitive communication deficit: Secondary | ICD-10-CM | POA: Diagnosis not present

## 2018-11-11 DIAGNOSIS — R41844 Frontal lobe and executive function deficit: Secondary | ICD-10-CM | POA: Diagnosis not present

## 2018-11-11 DIAGNOSIS — R41840 Attention and concentration deficit: Secondary | ICD-10-CM | POA: Diagnosis not present

## 2018-11-11 NOTE — Therapy (Signed)
[facility] [address] [place], [place], [address] Phone: [phone]   Fax:  [phone]  Physical Therapy Evaluation  Patient Details  Name: Patrick Mclaughlin MRN: [id number] Date of Birth: 1996/02/15 Referring Provider (PT): Newman Pies, MD   Encounter Date: 11/11/2018  PT End of Session - 11/11/18 1616    Visit Number  1    Number of Visits  5    Date for PT Re-Evaluation  12/11/18    Authorization Type  [facility], $20 copay for each discipline    PT Start Time  1400    PT Stop Time  1450    PT Time Calculation (min)  50 min    Activity Tolerance  Patient tolerated treatment well    Behavior During Therapy  [facility] for tasks assessed/performed       Past Medical History:  Diagnosis Date  . Acne   . Allergy    seasonal allergies  . Concussion   . Seasonal allergies     Past Surgical History:  Procedure Laterality Date  . NASAL SEPTOPLASTY W/ TURBINOPLASTY Bilateral 05/05/2016   Procedure: NASAL SEPTOPLASTY WITH TURBINATE REDUCTION;  Surgeon: Rozetta Nunnery, MD;  Location: [facility];  Service: ENT;  Laterality: Bilateral;    Vitals:   11/11/18 1431  BP: 121/84  Pulse: 84     Subjective Assessment - 11/11/18 1404    Subjective  Pt had syncope episode at PCP office with [facility] and 5 day hospital stay.  Since pt has been home restrictions include drinking 2L water each day, having someone with him on stairs and no driving or riding his bike. Pt took a nap after OT session and notices he is yawning more.  Mother notices a slight delay in his answers.  Denies change in vision or dizziness.  Has had two other syncopal episodes during his life surgery and seeing blood.    Patient is accompained by:  Family member    Pertinent History  allergies, surgery for deviated septum, has had fainting spells after seeing blood and after surgery    Patient Stated Goals  To be able to drive, walk up stairs  without supervision, and lift weights    Currently in Pain?  No/denies   slight pressure in head        [facility] PT Assessment - 11/11/18 1417      Assessment   Medical Diagnosis  SAH    Referring Provider (PT)  Newman Pies, MD    Onset Date/Surgical Date  10/28/18    Next MD Visit  Cardiology Monday    Prior Therapy  None      Precautions   Precautions  Fall    Precaution Comments  previous syncope episodes when he sees blood, after surgery      Balance Screen   Has the patient fallen in the past 6 months  Yes    How many times?  1   day of syncope   Has the patient had a decrease in activity level because of a fear of falling?   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[employer]  lifting weights, riding bike      Observation/Other Assessments   Focus on Therapeutic Outcomes (FOTO)   Not indicated      Sensation   Light Touch  Appears Intact      ROM / Strength   AROM / PROM / Strength  Strength      Strength   Overall Strength  Within functional limits for tasks performed      Ambulation/Gait   Ambulation/Gait  Yes    Ambulation/Gait Assistance  7: Independent    Assistive device  None    Gait Pattern  Within Functional Limits    Ambulation Surface  Level;Indoor    Gait velocity  WFL    Stairs  Yes    Stairs Assistance  5: Supervision    Stair Management Technique  No rails;Alternating pattern;Forwards    Number of Stairs  12    Height of Stairs  6      6 Minute Walk- Baseline   6 Minute Walk- Baseline  yes    BP (mmHg)  121/84    HR (bpm)  84     Modified Borg Scale for Dyspnea  0- Nothing at all    Perceived Rate of Exertion (Borg)  6-      6 Minute walk- Post Test   6 Minute Walk Post Test  yes    BP (mmHg)  132/72    HR (bpm)  87    Modified Borg Scale for Dyspnea  0.5- Very, very slight shortness of breath    Perceived Rate of Exertion (Borg)  11- Fairly light      6 minute walk test results    Endurance additional comments  10 minutes on treadmill at 2.5 mph; reported a decrease in headache pressure      Standardized Balance Assessment   Standardized Balance Assessment  Timed Up and Go Test      Timed Up and Go Test   TUG  Cognitive TUG;Normal TUG;Manual TUG    Normal TUG (seconds)  7.16    Manual TUG (seconds)  6.59    Cognitive TUG (seconds)  6.16    TUG Comments  all WFL      Functional Gait  Assessment   Gait assessed   Yes    Gait Level Surface  Walks 20 ft in less than 5.5 sec, no assistive devices, good speed, no evidence for imbalance, normal gait pattern, deviates no more than 6 in outside of the 12 in walkway width.    Change in Gait Speed  Able to smoothly change walking speed without loss of balance or gait deviation. Deviate no more than 6 in outside of the 12 in walkway width.    Gait with Horizontal Head Turns  Performs head turns smoothly with no change in gait. Deviates no more than 6 in outside 12 in walkway width    Gait with Vertical Head Turns  Performs head turns with no change in gait. Deviates no more than 6 in outside 12 in walkway width.    Gait and Pivot Turn  Pivot turns safely within 3 sec and stops quickly with no loss of balance.    Step Over Obstacle  Is able to step over 2 stacked shoe boxes taped together (9 in total height) without changing  gait speed. No evidence of imbalance.    Gait with Narrow Base of Support  Is able to ambulate for 10 steps heel to toe with no staggering.    Gait with Eyes Closed  Walks 20 ft, no assistive devices, good speed, no evidence of imbalance, normal gait  pattern, deviates no more than 6 in outside 12 in walkway width. Ambulates 20 ft in less than 7 sec.    Ambulating Backwards  Walks 20 ft, no assistive devices, good speed, no evidence for imbalance, normal gait    Steps  Alternating feet, no rail.    Total Score  30    FGA comment:  30/30 low falls risk: reported feeling "weird" when ambulating backwards                Objective measurements completed on examination: See above findings.              PT Education - 11/11/18 1615    Education Details  return to activity guidelines, clinical findings, PT POC and goals    Person(s) Educated  Patient;Child(ren)    Methods  Explanation    Comprehension  Verbalized understanding          PT Long Term Goals - 11/11/18 1626      PT LONG TERM GOAL #1   Title  Pt will report return to riding his street bike for 30 minutes safely and will be independent with aerobic and resistance training HEP    Time  4    Period  Weeks    Status  New    Target Date  12/11/18      PT LONG TERM GOAL #2   Title  Pt will report return to driving for short duration in his neighborhood reporting minimal to no symptoms of heaviness or sensitivity to cars moving beside him    Time  4    Period  Weeks    Status  New    Target Date  12/11/18      PT LONG TERM GOAL #3   Title  Pt will demonstrate within age range score for SOT    Baseline  TBD    Time  4    Period  Weeks    Status  New    Target Date  12/11/18      PT LONG TERM GOAL #4   Title  Pt will negotiate 24 stairs without rail, alternating sequence without symptoms of lightheadedness, independently    Time  4    Period  Weeks    Status  New    Target Date  12/11/18             Plan - 11/11/18 1616    Clinical Impression Statement  Pt is a 23 year old male referred to Neuro OPPT for evaluation of dizziness and balance impairments after syncopal episode, fall with resultant cerebral contusion and laceration and SAH.   Pt's PMH is significant for the following: acne and seasonal allergies. The following deficits were noted during pt's exam: decreased activity tolerance, requires skilled guidance for return to activities and motion sensitivity.  Vestibular impairments leading to motion sensitivity will be more fully assessed at next visit.  Pt would benefit from skilled PT to address these impairments and functional limitations to maximize functional mobility independence and reduce falls risk.    History and Personal Factors relevant to plan of care:  no significant PMH but has had previous syncope episodes after surgery,  seeing blood, getting a vaccine, SAH, unable to drive, lift weights, ride bike or work at internship currently    Clinical Presentation  Stable    Clinical Presentation due to:  no significant PMH but has had previous syncope episodes after surgery, seeing blood, getting a vaccine, SAH, unable to drive, lift weights, ride bike or work at internship currently    Clinical Decision Making  Low    Rehab Potential  Excellent    PT Frequency  1x / week    PT Duration  4 weeks    PT Treatment/Interventions  ADLs/Self Care Home Management;Stair training;Functional mobility training;Therapeutic activities;Therapeutic exercise;Balance training;Neuromuscular re-education;Patient/family education;Cognitive remediation;Vestibular;Energy conservation    PT Next Visit Plan  vestibular assessment; [place] concussion treadmill test, SOT.  Initiate light-moderate aerobic activity program-pt will likely use stationary bike for now at [employer].  Vestibular/balance exercises if needed.  Muscular endurance training with low weight, high reps.  Dual task activities.    Consulted and Agree with Plan of Care  Patient;Family member/caregiver    Family Member Consulted  Mother       Patient will benefit from skilled therapeutic intervention in order to improve the following deficits and impairments:  Decreased activity  tolerance, Dizziness  Visit Diagnosis: Other symptoms and signs involving the nervous system  Dizziness and giddiness     Problem List Patient Active Problem List   Diagnosis Date Noted  . SAH (subarachnoid hemorrhage) ([place])   . Leukocytosis   . Cerebral contusion and laceration with loss of consciousness ([place]) 10/28/2018    Rico Junker, PT, DPT 11/11/18    4:36 PM    [facility] [address] [place], [place], [address] Phone: [phone]   Fax:  [phone]  Name: Patrick Mclaughlin MRN: [id number] Date of Birth: May 09, 1996

## 2018-11-11 NOTE — Therapy (Signed)
[facility] [address] [place], [place], [address] Phone: [phone]   Fax:  [phone]  Occupational Therapy Evaluation  Patient Details  Name: Patrick Mclaughlin MRN: [id number] Date of Birth: [birth year] Referring Provider (OT): Dr. Arnoldo Morale   Encounter Date: 11/09/2018  OT End of Session - 11/11/18 1130    Visit Number  1    Number of Visits  6   Date for OT Re-Evaluation  12/16/2018   Authorization Type  [facility]    Authorization Time Period  clinical eligibility check after visit 25    OT Start Time  1456    OT Stop Time  1530    OT Time Calculation (min)  34 min    Activity Tolerance  Treatment limited secondary to agitation    Behavior During Therapy  Restless;Anxious       Past Medical History:  Diagnosis Date  . Acne   . Allergy    seasonal allergies  . Concussion   . Seasonal allergies     Past Surgical History:  Procedure Laterality Date  . NASAL SEPTOPLASTY W/ TURBINOPLASTY Bilateral 05/05/2016   Procedure: NASAL SEPTOPLASTY WITH TURBINATE REDUCTION;  Surgeon: Rozetta Nunnery, MD;  Location: [facility];  Service: ENT;  Laterality: Bilateral;    There were no vitals filed for this visit.     [facility] OT Assessment - 11/11/18 0001      Assessment   Medical Diagnosis  skull fx, [facility]    Referring Provider (OT)  Dr. Arnoldo Morale    Onset Date/Surgical Date  10/28/18      Precautions   Precautions  Fall      Balance Screen   Has the patient fallen in the past 6 months  Yes    How many times?  1    Has the patient had a decrease in activity level because of a fear of falling?   No    Is the patient reluctant to leave their home because of a fear of falling?   No      Home  Environment   Family/patient expects to be discharged to:  Private residence    Type of [place]  Bed/Bath upstairs    Lives With  Family      Prior Function   Level of Independence   Independent    Vocation  Student      ADL   ADL comments  modified I with all basic ADLS      IADL   Meal Prep  Able to complete simple warm meal prep      Mobility   Mobility Status  Independent      Written Expression   Dominant Hand  Right    Handwriting  100% legible      Vision - History   Baseline Vision  Wears glasses all the time      Vision Assessment   Tracking/Visual Pursuits  Other (comment)    Saccades  --   Tracks to all 4 quadrants, min v.c to sustain fixation   Visual Fields  No apparent deficits    Comment  Decreased tolerance of moving objects when in car      Activity Tolerance   Activity Tolerance  --   Decreased overall activity tolerance reports mental fatigue     Cognition   Overall Cognitive Status  Impaired/Different from baseline    Attention  Alternating;Divided  Divided Attention  Impaired    Divided Attention Impairment  Functional complex    Awareness  Impaired    Awareness Impairment  Intellectual impairment   decreased awareness of higher level cogntive deficits   Problem Solving  Appears intact    Executive Function  Organizing;Sequencing    Sequencing  Appears intact    Organizing  Appears intact    Behaviors  Impulsive   mild, pt's mom states pt moved quicly prior to accident   Cognition Comments  Pt demonstrates ability to complete a pipe tree design without prompting,  pt completes a mod complex organization task independently.. Pt demonstrates difficulty dividing attention between cognitive and physical task, requires cueing.                             OT Long Term Goals - 11/11/18 1458      OT LONG TERM GOAL #1   Title  Pt will demonstrate ability to perform functional activities requiring divided attention with 95% or better accuracy in prep for driving and retun to school.    Time  5    Period  Weeks    Status  New      OT LONG TERM GOAL #2   Title  I with HEP for cognitive activities.    Time   5    Period  Weeks    Status  New      OT LONG TERM GOAL #3   Title  Pt / family will verbalize understanding of cognitive compensation strategies prn.      OT LONG TERM GOAL #4   Title  Pt/ family will verbalize understanding of recommendations regarding retrun to school and driving.    Time  5    Period  Weeks    Status  New            Plan - 11/11/18 1112    Clinical Impression Statement  23 y.o male was admitted on 10/28/2018 due to a syncopal episode at his primary care office. There was a brief loss of consciousness and mental status changes. He was brought to the [facility] ED, where a CT scan demonstrated a skull fracture and cerebral contusion. Pt. with Cerebral contusion and laceration with loss of consciousness (HCC),SAH,  and Leukocytosis. Pt was d/c home with family on 11/03/2018 Pt presents with the following deficits: higher level  cognitive deficits, decreased activity tolerance, and increased sensitivity to moving visual stimuli. Pt can benefit from skilled occupational therapy to address these deficitis in order to maximize pt's safety and I with ADLs/IADLs.    Occupational Profile and client history currently impacting functional performance  PMH: syncopal episode, skull fx, cerebral contusion, SAH, pt to f/u with PCP re: thyroid nodule. Pt is a full time student at [employer], He was scheduled to participate in an intership this semester. Pt is unable to participate in internship / school activities at this time due to his deficits.     Occupational performance deficits (Please refer to evaluation for details):  ADL's;Leisure;IADL's;Play;Work;Education;Rest and Sleep;Social Participation    Rehab Potential  Good    OT Frequency  1x / week    OT Duration  --   5 weeks   OT Treatment/Interventions  Self-care/ADL training;Visual/perceptual remediation/compensation;Patient/family education;Therapeutic activities;Cognitive remediation/compensation;Balance training    Plan   HEP for  cognition   Clinical Decision Making  Limited treatment options, no task modification necessary  Consulted and Agree with Plan of Care  Patient;Family member/caregiver    Family Member Consulted  mother       Patient will benefit from skilled therapeutic intervention in order to improve the following deficits and impairments:  Decreased cognition, Impaired vision/preception, Decreased activity tolerance, Decreased endurance, Impaired perceived functional ability, Decreased safety awareness, Decreased knowledge of precautions  Visit Diagnosis: Attention and concentration deficit - Plan: Ot plan of care cert/re-cert  Frontal lobe and executive function deficit - Plan: Ot plan of care cert/re-cert    Problem List Patient Active Problem List   Diagnosis Date Noted  . SAH (subarachnoid hemorrhage) ([place])   . Leukocytosis   . Cerebral contusion and laceration with loss of consciousness ([place]) 10/28/2018    Hayly Litsey 11/11/2018, 3:11 PM Theone Murdoch, OTR/L [phone] Phone: [phone] 3:11 PM 11/11/18 [facility] [address] [place], [place], [address] Phone: [phone]   Fax:  [phone]  Name: Patrick Mclaughlin MRN: [id number] Date of Birth: 1996-06-21

## 2018-11-14 ENCOUNTER — Ambulatory Visit: Payer: 59 | Admitting: Internal Medicine

## 2018-11-14 ENCOUNTER — Encounter: Payer: Self-pay | Admitting: Internal Medicine

## 2018-11-14 VITALS — BP 114/64 | HR 69 | Ht 72.0 in | Wt 145.6 lb

## 2018-11-14 DIAGNOSIS — G90A Postural orthostatic tachycardia syndrome (POTS): Secondary | ICD-10-CM

## 2018-11-14 DIAGNOSIS — R55 Syncope and collapse: Secondary | ICD-10-CM | POA: Diagnosis not present

## 2018-11-14 DIAGNOSIS — R000 Tachycardia, unspecified: Secondary | ICD-10-CM | POA: Diagnosis not present

## 2018-11-14 DIAGNOSIS — I951 Orthostatic hypotension: Secondary | ICD-10-CM

## 2018-11-14 NOTE — Patient Instructions (Signed)
Medication Instructions:  No changes If you need a refill on your cardiac medications before your next appointment, please call your pharmacy.   Lab work: none If you have labs (blood work) drawn today and your tests are completely normal, you will receive your results only by: Marland Kitchen MyChart Message (if you have MyChart) OR . A paper copy in the mail If you have any lab test that is abnormal or we need to change your treatment, we will call you to review the results.  Testing/Procedures: none  Follow-Up:  February 28 at 11:00 am for nurse visit for orthostatic blood pressure check.  Any Other Special Instructions Will Be Listed Below (If Applicable).

## 2018-11-15 NOTE — Progress Notes (Signed)
Cardiology Office Note   Date:  11/15/2018   ID:  Patrick Mclaughlin, Patrick Mclaughlin 10-14-1995, MRN [id number]  PCP:  Lawerance Cruel, MD  Cardiologist:   Dorris Carnes, MD   F/U of syncope     History of Present Illness: Patrick Mclaughlin is a 23 y.o. male who was recently discharged from [facility] After a syncopal spell with resultant TBI The pt has a  history of dizziness/presyncope Had near syncopal spell when cut on face several years ago  He has given blood in past but with shots, needles has had dizziness Pt is an active 23 yo   Sophomore in college    At home over break    ? If had breakfast moring of spell   Was seen by primary MD for travel shots  Two shots given   After getting the shots, the pt went to check out    That is when he passed out Pt does not remember how he felt right before  Pt hosp on neurosurgery service with TBI/SAH. In hosp pt was bradycardic while in bed   He was orthostatic with standing   Received IV hydration with intravenous saline prior to discharge   HR improved but not normalized  The pt says he is feeling about 95% back to baseline   At home    Slowly increasing activity  Appetite is OK   No nausea    HA is gone Denies dizziness    No CP   No SOB         Current Meds  Medication Sig  . fexofenadine (ALLEGRA) 180 MG tablet Take 180 mg by mouth as needed for allergies or rhinitis.  Marland Kitchen HYDROcodone-acetaminophen (NORCO/VICODIN) 5-325 MG tablet Take 1-2 tablets by mouth every 6 (six) hours as needed for moderate pain or severe pain.     Allergies:   Patient has no known allergies.   Past Medical History:  Diagnosis Date  . Acne   . Allergy    seasonal allergies  . Concussion   . Seasonal allergies     Past Surgical History:  Procedure Laterality Date  . NASAL SEPTOPLASTY W/ TURBINOPLASTY Bilateral 05/05/2016   Procedure: NASAL SEPTOPLASTY WITH TURBINATE REDUCTION;  Surgeon: Rozetta Nunnery, MD;  Location: [facility];  Service:  ENT;  Laterality: Bilateral;     Social History:  The patient  reports that he has never smoked. He has never used smokeless tobacco. He reports that he does not drink alcohol or use drugs.   Family History:  The patient's family history includes COPD in his paternal grandmother; Diabetes in his father.    ROS:  Please see the history of present illness. All other systems are reviewed and  Negative to the above problem except as noted.    PHYSICAL EXAM: VS:  BP 114/64   Pulse 69   Ht 6' (1.829 m)   Wt 145 lb 9.6 oz (66 kg)   SpO2 94%   BMI 19.75 kg/m   BP laying   116/62   P 88   Sitting:    112/60   P 93    Standing:   112/58  P 105   Standing at 4 min  106/52   P 128   (Pt dizzy)  GEN: Thin 23 yo  in no acute distress  HEENT: normal  Neck: no JVD, carotid bruits, or masses Cardiac: RRR; no murmurs, rubs, or gallops,no edema  Respiratory:  clear to auscultation bilaterally, normal work of breathing GI: soft, nontender, nondistended, + BS  No hepatomegaly  MS: no deformity Moving all extremities   Skin: warm and dry, no rash Neuro:  deferred  EKG:  EKG is not ordered today.   Lipid Panel No results found for: CHOL, TRIG, HDL, CHOLHDL, VLDL, LDLCALC, LDLDIRECT    Wt Readings from Last 3 Encounters:  11/14/18 145 lb 9.6 oz (66 kg)  11/03/18 152 lb 8.9 oz (69.2 kg)  05/05/16 155 lb (70.3 kg) (62 %, Z= 0.30)*   * Growth percentiles are based on CDC (Boys, 2-20 Years) data.      ASSESSMENT AND PLAN:  1   Syncope   Spell most likely related to patient's vasovagal tendencies in setting of baseline slow HR.   I reviewed this with pt and mother (how to avoid episodes in future)  He is now still othostatic   This prob is related to recent TBI    I encouraged him to stay hydrated   3 to 4 L per day.   ALso recomm increased salt intake Slowly increase activity    Encouraged him to slowly add exercise (isometric and  Aerobid) to daily routing  The pt has f/u with Dr Arnoldo Morale  (neurosurgery) for follow up    No driving until seen If cleared by neurosurgery I think it is reasonable to allow   He has a precipitating cause for recent event  I will plan f/u in about 1 month to see how he is doing.      Current medicines are reviewed at length with the patient today.  The patient does not have concerns regarding medicines.  Signed, Dorris Carnes, MD  11/15/2018 11:05 [facility] [place], [place], [place]  [address] Phone: [phone]; Fax: [phone]

## 2018-11-18 ENCOUNTER — Ambulatory Visit: Payer: 59 | Attending: Neurosurgery | Admitting: Rehabilitative and Restorative Service Providers"

## 2018-11-18 ENCOUNTER — Ambulatory Visit: Payer: 59 | Admitting: Occupational Therapy

## 2018-11-18 DIAGNOSIS — R29818 Other symptoms and signs involving the nervous system: Secondary | ICD-10-CM | POA: Diagnosis not present

## 2018-11-18 DIAGNOSIS — R41840 Attention and concentration deficit: Secondary | ICD-10-CM

## 2018-11-18 DIAGNOSIS — R42 Dizziness and giddiness: Secondary | ICD-10-CM | POA: Diagnosis not present

## 2018-11-18 DIAGNOSIS — R41844 Frontal lobe and executive function deficit: Secondary | ICD-10-CM | POA: Diagnosis not present
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
## 2018-11-18 NOTE — Therapy (Signed)
[facility] [address] [place], [place], [address] Phone: [phone]   Fax:  [phone]  Physical Therapy Treatment  Patient Details  Name: Patrick Mclaughlin MRN: [id number] Date of Birth: 1995/12/24 Referring Provider (PT): Newman Pies, MD   Encounter Date: 11/18/2018  PT End of Session - 11/18/18 1619    Visit Number  2    Number of Visits  5    Date for PT Re-Evaluation  12/11/18    Authorization Type  [facility] [employer], $20 copay for each discipline    PT Start Time  1322    PT Stop Time  1425    PT Time Calculation (min)  63 min    Activity Tolerance  Patient tolerated treatment well    Behavior During Therapy  [facility] for tasks assessed/performed       Past Medical History:  Diagnosis Date  . Acne   . Allergy    seasonal allergies  . Concussion   . Seasonal allergies     Past Surgical History:  Procedure Laterality Date  . NASAL SEPTOPLASTY W/ TURBINOPLASTY Bilateral 05/05/2016   Procedure: NASAL SEPTOPLASTY WITH TURBINATE REDUCTION;  Surgeon: Rozetta Nunnery, MD;  Location: [facility];  Service: ENT;  Laterality: Bilateral;    There were no vitals filed for this visit.  Subjective Assessment - 11/18/18 1325    Subjective  He notes some pressure in his head when waking up, however this is improving.  No true headache or pain.   The patient saw cardiologist and has increased his water intake.    Pertinent History  allergies, surgery for deviated septum, has had fainting spells after seeing blood and after surgery    Patient Stated Goals  To be able to drive, walk up stairs without supervision, and lift weights    Currently in Pain?  No/denies             Min HR RPE Overall condition (Likert Scale) Symptoms/Observations  REST      Begin '@3'$ .6 mph for 5'5"; 3.2 mph for <5'5"; begin at 0 degrees  0 86 6 0 No symptoms  '1 114 9 0   2 115 9 0   3 113 9 0   4 117 10 0   5 122 11 0   6  131 12 0   7 ''139 11 0   8 143 13 0   9 148 13 0   10 145 13 0   11 160 14 0   12 165 14 0   13 174 '$ 14 0   14 180 15 0   15 180 16 0   With 15 degree incline reached, begin increasing speed 0.4 mph/min  16 -- 17 0 No sxs, reports feeling exhaustion  17 --     18 177     19      20      Post-Exercise (reduce speed to 2.5 mph x 2 minute cool down)  1 173  0 Exhaustion symptoms improved  2 138 10 0    *HR monitor did not read at minute 16 and 17.  Test was stopped due to c/o "exhaustion" with exertion, but no symptoms of HA, fogginess.   SENSORY ORGANIZATION TESTING:  71% compared to age/height norm of 70%, with WNLs use of all sensory systems and appropriate hip versus ankle strategies.  Patient did have mildly dec'd performance on condition 1, trial 3/ condition 3, trial  1 and 3/ condition 4, trial 1/ condition 5, trial 1.  He demonstrates a learning curve with conditions 4 and 5 scoring below average on trial 1 and then above average on trial 2-3.  SELF CARE/HOME MANAGEMENT: Discussed return to walking and exercise beginning at a lower intensity (60-70% of max heart rate) level and working up slowly based on his response over time.  Answered questions regarding work activities (for his upcoming co-op) and general questions about activity x 10 minutes.       PT Long Term Goals - 11/18/18 1621      PT LONG TERM GOAL #1   Title  Pt will report return to riding his street bike for 30 minutes safely and will be independent with aerobic and resistance training HEP    Time  4    Period  Weeks    Status  On-going      PT LONG TERM GOAL #2   Title  Pt will report return to driving for short duration in his neighborhood reporting minimal to no symptoms of heaviness or sensitivity to cars moving beside him    Time  4    Period  Weeks    Status  On-going      PT LONG TERM GOAL #3   Title  Pt will demonstrate within age range score for SOT    Baseline  scores 71% (compared to  70% normative data for age/height)    Time  4    Period  Weeks    Status  Achieved      PT LONG TERM GOAL #4   Title  Pt will negotiate 24 stairs without rail, alternating sequence without symptoms of lightheadedness, independently    Time  4    Period  Weeks    Status  On-going            Plan - 11/18/18 1706    Clinical Impression Statement  The patient scores WNLs for SOT.  He did not have exertional symptoms during [place] treadmill test, however, he did note "exhaustion".  PT stopped test early due to acuity of injury and subjective reports of exhaustion.  Patient met 1 LTG.      PT Treatment/Interventions  ADLs/Self Care Home Management;Stair training;Functional mobility training;Therapeutic activities;Therapeutic exercise;Balance training;Neuromuscular re-education;Patient/family education;Cognitive remediation;Vestibular;Energy conservation    PT Next Visit Plan  Provide HEP:  pillow stand with eyes closed, pillow stand with head motion + eyes open.  Return to walking activities or stationery bike at lower intensity of 60--70% and increase as tolerated.  Have patient demo his circuit training for home.  Consider early d/c as patient is progressing well.    Consulted and Agree with Plan of Care  Patient       Patient will benefit from skilled therapeutic intervention in order to improve the following deficits and impairments:  Decreased activity tolerance, Dizziness  Visit Diagnosis: Other symptoms and signs involving the nervous system  Dizziness and giddiness     Problem List Patient Active Problem List   Diagnosis Date Noted  . SAH (subarachnoid hemorrhage) ([facility])   . Leukocytosis   . Cerebral contusion and laceration with loss of consciousness ([facility]) 10/28/2018    Mala Gibbard, PT 11/18/2018, 5:09 PM  [facility] [address] [place], [place], [address] Phone: [phone]   Fax:   [phone]  Name: CARROLL LINGELBACH MRN: [id number] Date of Birth: 1996-02-11

## 2018-11-22 ENCOUNTER — Ambulatory Visit: Payer: 59 | Admitting: Occupational Therapy

## 2018-11-22 DIAGNOSIS — R42 Dizziness and giddiness: Secondary | ICD-10-CM | POA: Diagnosis not present

## 2018-11-22 DIAGNOSIS — R41840 Attention and concentration deficit: Secondary | ICD-10-CM | POA: Diagnosis not present

## 2018-11-22 DIAGNOSIS — R29818 Other symptoms and signs involving the nervous system: Secondary | ICD-10-CM | POA: Diagnosis not present

## 2018-11-22 DIAGNOSIS — R41844 Frontal lobe and executive function deficit: Secondary | ICD-10-CM

## 2018-11-23 ENCOUNTER — Ambulatory Visit: Payer: 59 | Admitting: Occupational Therapy

## 2018-11-23 ENCOUNTER — Encounter: Payer: Self-pay | Admitting: Physical Therapy

## 2018-11-23 ENCOUNTER — Ambulatory Visit: Payer: 59 | Admitting: Physical Therapy

## 2018-11-23 DIAGNOSIS — R41844 Frontal lobe and executive function deficit: Secondary | ICD-10-CM | POA: Diagnosis not present

## 2018-11-23 DIAGNOSIS — R29818 Other symptoms and signs involving the nervous system: Secondary | ICD-10-CM

## 2018-11-23 DIAGNOSIS — R41840 Attention and concentration deficit: Secondary | ICD-10-CM | POA: Diagnosis not present

## 2018-11-23 DIAGNOSIS — R42 Dizziness and giddiness: Secondary | ICD-10-CM

## 2018-11-23 NOTE — Therapy (Signed)
[facility] [address] [place], [place], [address] Phone: [phone]   Fax:  [phone]  Physical Therapy Treatment  Patient Details  Name: Patrick Mclaughlin MRN: [id number] Date of Birth: 07/23/1996 Referring Provider (PT): Newman Pies, MD   Encounter Date: 11/23/2018  PT End of Session - 11/23/18 1516    Visit Number  3    Number of Visits  5    Date for PT Re-Evaluation  12/11/18    Authorization Type  [facility] [employer], $20 copay for each discipline    PT Start Time  1445    PT Stop Time  1515    PT Time Calculation (min)  30 min    Activity Tolerance  Patient tolerated treatment well    Behavior During Therapy  [facility] for tasks assessed/performed       Past Medical History:  Diagnosis Date  . Acne   . Allergy    seasonal allergies  . Concussion   . Seasonal allergies     Past Surgical History:  Procedure Laterality Date  . NASAL SEPTOPLASTY W/ TURBINOPLASTY Bilateral 05/05/2016   Procedure: NASAL SEPTOPLASTY WITH TURBINATE REDUCTION;  Surgeon: Rozetta Nunnery, MD;  Location: [facility];  Service: ENT;  Laterality: Bilateral;    There were no vitals filed for this visit.  Subjective Assessment - 11/23/18 1447    Subjective  doing well - feels ready for discharge. Only wants to touch on high level balance    Pertinent History  allergies, surgery for deviated septum, has had fainting spells after seeing blood and after surgery    Patient Stated Goals  To be able to drive, walk up stairs without supervision, and lift weights    Currently in Pain?  No/denies    Pain Score  0-No pain                       [facility] Adult PT Treatment/Exercise - 11/23/18 0001      Exercises   Exercises  Knee/Hip      Knee/Hip Exercises: Aerobic   Elliptical  L5 x 6 min      Knee/Hip Exercises: Standing   Other Standing Knee Exercises  fwd/bwd lunges x 15 reps each - cueing to increase BOS           Balance Exercises - 11/23/18 1450      Balance Exercises: Standing   Standing Eyes Closed  Narrow base of support (BOS);Head turns;Foam/compliant surface;3 reps;30 secs   EC, progression to horizontal and vertical head turns   Tandem Stance  Eyes closed;Foam/compliant surface;3 reps;30 secs   EC, progression to horizontal and verticla head turns   Tandem Gait  Forward;Retro    Retro Gait  --   eyes closed - 1 square deviation   Other Standing Exercises  eyes closed with fwd gait - 2 square deviation             PT Long Term Goals - 11/23/18 1519      PT LONG TERM GOAL #1   Title  Pt will report return to riding his street bike for 30 minutes safely and will be independent with aerobic and resistance training HEP    Time  4    Period  Weeks    Status  Deferred   wants to see neurologist for clearance prior to initiating     PT LONG TERM GOAL #2   Title  Pt will report  return to driving for short duration in his neighborhood reporting minimal to no symptoms of heaviness or sensitivity to cars moving beside him    Time  4    Period  Weeks    Status  Deferred   awaiting clearance from neurologist     PT [facility] #3   Title  Pt will demonstrate within age range score for SOT    Baseline  scores 71% (compared to 70% normative data for age/height)    Time  4    Period  Weeks    Status  Achieved      PT LONG TERM GOAL #4   Title  Pt will negotiate 24 stairs without rail, alternating sequence without symptoms of lightheadedness, independently    Time  4    Period  Weeks    Status  Achieved            Plan - 11/23/18 1520    Clinical Impression Statement  Patient doing really well - reports only slight LOB while perforing lunges. Education on high level balance activities today with patient tolerating well. min guard for safety, however no overt LOB - patient able to self correct throughout. Reviewed lunge technique - with cueing for increased BOS  patient much more stable. PT and patient feels as if patient is ready for discharge. Will place on hold currently until after neurology appointment to enure no further questions or issues that patient may need to return to PT for. All goals et or deferred at this time - patient awaiting MD clearance for driving and outdoor bike riding. Patient has made excellent progress.     PT Treatment/Interventions  ADLs/Self Care Home Management;Stair training;Functional mobility training;Therapeutic activities;Therapeutic exercise;Balance training;Neuromuscular re-education;Patient/family education;Cognitive remediation;Vestibular;Energy conservation    PT Next Visit Plan  on hold until neurology appointment 2/25. likely no further appointments needed.    Consulted and Agree with Plan of Care  Patient       Patient will benefit from skilled therapeutic intervention in order to improve the following deficits and impairments:  Decreased activity tolerance, Dizziness  Visit Diagnosis: Other symptoms and signs involving the nervous system  Dizziness and giddiness     Problem List Patient Active Problem List   Diagnosis Date Noted  . SAH (subarachnoid hemorrhage) ([facility])   . Leukocytosis   . Cerebral contusion and laceration with loss of consciousness ([facility]) 10/28/2018     Lanney Gins, PT, DPT [facility] Physical Therapist 11/23/18 3:33 PM Pager: [phone] Office: [facility] [address] [place], [place], [address] Phone: [phone]   Fax:  [phone]  Name: Patrick Mclaughlin MRN: [id number] Date of Birth: Mar 10, 1996
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
## 2018-11-29 DIAGNOSIS — R634 Abnormal weight loss: Secondary | ICD-10-CM | POA: Diagnosis not present

## 2018-11-29 DIAGNOSIS — E041 Nontoxic single thyroid nodule: Secondary | ICD-10-CM | POA: Diagnosis not present

## 2018-11-30 ENCOUNTER — Encounter: Payer: Self-pay | Admitting: Occupational Therapy

## 2018-11-30 ENCOUNTER — Ambulatory Visit: Payer: 59 | Admitting: Occupational Therapy

## 2018-11-30 ENCOUNTER — Ambulatory Visit: Payer: 59 | Admitting: Physical Therapy

## 2018-11-30 DIAGNOSIS — R41844 Frontal lobe and executive function deficit: Secondary | ICD-10-CM | POA: Diagnosis not present

## 2018-11-30 DIAGNOSIS — R41840 Attention and concentration deficit: Secondary | ICD-10-CM | POA: Diagnosis not present

## 2018-11-30 DIAGNOSIS — R29818 Other symptoms and signs involving the nervous system: Secondary | ICD-10-CM | POA: Diagnosis not present

## 2018-11-30 DIAGNOSIS — R42 Dizziness and giddiness: Secondary | ICD-10-CM | POA: Diagnosis not present

## 2018-11-30 NOTE — Therapy (Signed)
[facility] [address] [place], [place], [address] Phone: [phone]   Fax:  [phone]  Occupational Therapy Treatment  Patient Details  Name: Patrick Mclaughlin MRN: [id number] Date of Birth: 07-Nov-1995 Referring Provider (OT): Dr. Arnoldo Morale   Encounter Date: 11/30/2018  OT End of Session - 11/30/18 1639    Visit Number  4    Number of Visits  9    Date for OT Re-Evaluation  01/10/19    Authorization Type  [facility] [employer]    Authorization Time Period  clinical eligibility check after visit 25    OT Start Time  1316    OT Stop Time  1400    OT Time Calculation (min)  44 min    Activity Tolerance  Patient tolerated treatment well       Past Medical History:  Diagnosis Date  . Acne   . Allergy    seasonal allergies  . Concussion   . Seasonal allergies     Past Surgical History:  Procedure Laterality Date  . NASAL SEPTOPLASTY W/ TURBINOPLASTY Bilateral 05/05/2016   Procedure: NASAL SEPTOPLASTY WITH TURBINATE REDUCTION;  Surgeon: Rozetta Nunnery, MD;  Location: [facility];  Service: ENT;  Laterality: Bilateral;    There were no vitals filed for this visit.  Subjective Assessment - 11/30/18 1321    Subjective   I have only had one headache since getting out of the hospital.    Pertinent History  syncopal episode, skull fx, [facility] 10/28/2018    Patient Stated Goals  to return to baseline, drive, ride a bike, exercise    Currently in Pain?  No/denies          Treatment: Addressed both divided and alternating attention using quantitative math activity (level 5), alphabetizing grocery list, and symbol digits modality activity - asking pt to randomly switch between activities as well as to participate in ongoing conversation while doing tasks.  Pt with 90% accuracy on math, missed 2 words when making alphabet list, 100% accuracy on symbol digits modality.  Pt demonstrated good independent strategies for  keeping on track for tasks including verbalizing out loud, using place markers, double checking answers and employing error detection strategies. Discussed briefly with mom and pt that pt may want to consider neuropsych testing in 2-3 months and referral info given. Pt and mom verbalized understanding.                    OT Short Term Goals - 11/11/18 1124      OT SHORT TERM GOAL #1   Title  --    Time  --    Period  --    Status  --    Target Date  --      OT SHORT TERM GOAL #2   Title  --    Time  --    Period  --    Status  --        OT Long Term Goals - 11/30/18 1638      OT LONG TERM GOAL #1   Title  Pt will demonstrate ability to perform functional activities requiring divided attention with 95% or better accuracy in prep for driving and retun to school.    Time  5    Period  Weeks    Status  On-going      OT LONG TERM GOAL #2   Title  I with HEP for cognitive activities.  Time  5    Period  Weeks    Status  On-going      OT LONG TERM GOAL #3   Title  Pt / family will verbalize understanding of cognitive compensation strategies prn.    Status  On-going      OT LONG TERM GOAL #4   Title  Pt/ family will verbalize understanding of recommendations regarding retrun to school and driving.    Time  5    Period  Weeks    Status  On-going            Plan - 11/30/18 1638    Clinical Impression Statement  Pt progressing toward goals. Pt with improved performance with alternating and divided attention tasks today    Occupational Profile and client history currently impacting functional performance  PMH: syncopal episode, skull fx, cerebral contusion, SAH, pt to f/u with PCP re: thyroid nodule. Pt is a full time student at [employer], He was scheduled to participate in an intership this semester. Pt is unable to participate in internship / school activities at this time due to his deficits.     Occupational performance deficits (Please refer to  evaluation for details):  ADL's;Leisure;IADL's;Play;Work;Education;Rest and Sleep;Social Participation    Rehab Potential  Good    OT Frequency  1x / week    OT Duration  Other (comment)   5 weeks   OT Treatment/Interventions  Self-care/ADL training;Visual/perceptual remediation/compensation;Patient/family education;Therapeutic activities;Cognitive remediation/compensation;Balance training    Plan  continue to address divided / alternating attention, recommendations for home activities/ HEP, recovery process.    Consulted and Agree with Plan of Care  Patient;Family member/caregiver    Family Member Consulted  mother at end of session       Patient will benefit from skilled therapeutic intervention in order to improve the following deficits and impairments:  Decreased cognition, Impaired vision/preception, Decreased activity tolerance, Decreased endurance, Impaired perceived functional ability, Decreased safety awareness, Decreased knowledge of precautions  Visit Diagnosis: Frontal lobe and executive function deficit  Attention and concentration deficit    Problem List Patient Active Problem List   Diagnosis Date Noted  . SAH (subarachnoid hemorrhage) ([facility])   . Leukocytosis   . Cerebral contusion and laceration with loss of consciousness ([facility]) 10/28/2018    Quay Burow, OTR/L 11/30/2018, 4:41 PM  [facility] [address] [place], [place], [address] Phone: [phone]   Fax:  [phone]  Name: Patrick Mclaughlin MRN: [id number] Date of Birth: 1995/12/27

## 2018-12-05 ENCOUNTER — Other Ambulatory Visit: Payer: Self-pay | Admitting: Family Medicine

## 2018-12-05 DIAGNOSIS — E041 Nontoxic single thyroid nodule: Secondary | ICD-10-CM

## 2018-12-06 DIAGNOSIS — S02119A Unspecified fracture of occiput, initial encounter for closed fracture: Secondary | ICD-10-CM | POA: Diagnosis not present

## 2018-12-06 DIAGNOSIS — S06321D Contusion and laceration of left cerebrum with loss of consciousness of 30 minutes or less, subsequent encounter: Secondary | ICD-10-CM | POA: Diagnosis not present

## 2018-12-06 DIAGNOSIS — S069X9A Unspecified intracranial injury with loss of consciousness of unspecified duration, initial encounter: Secondary | ICD-10-CM | POA: Diagnosis not present

## 2018-12-07 ENCOUNTER — Ambulatory Visit: Payer: 59 | Admitting: Occupational Therapy

## 2018-12-07 ENCOUNTER — Ambulatory Visit: Payer: 59 | Admitting: Physical Therapy

## 2018-12-07 ENCOUNTER — Encounter: Payer: Self-pay | Admitting: Occupational Therapy

## 2018-12-07 DIAGNOSIS — R41840 Attention and concentration deficit: Secondary | ICD-10-CM | POA: Diagnosis not present

## 2018-12-07 DIAGNOSIS — R41844 Frontal lobe and executive function deficit: Secondary | ICD-10-CM

## 2018-12-07 DIAGNOSIS — R42 Dizziness and giddiness: Secondary | ICD-10-CM | POA: Diagnosis not present

## 2018-12-07 DIAGNOSIS — R29818 Other symptoms and signs involving the nervous system: Secondary | ICD-10-CM | POA: Diagnosis not present

## 2018-12-07 NOTE — Therapy (Signed)
[facility] [address] [place], [place], [address] Phone: [phone]   Fax:  [phone]  Occupational Therapy Treatment  Patient Details  Name: Patrick Mclaughlin MRN: [id number] Date of Birth: [birth year] Referring Provider (OT): Dr. Arnoldo Morale   Encounter Date: 12/07/2018  OT End of Session - 12/07/18 1342    Visit Number  5    Number of Visits  9    Date for OT Re-Evaluation  01/10/19    Authorization Type  [facility]    Authorization Time Period  clinical eligibility check after visit 25    OT Start Time  1146    OT Stop Time  1230    OT Time Calculation (min)  44 min    Activity Tolerance  Patient tolerated treatment well    Behavior During Therapy  [facility] for tasks assessed/performed       Past Medical History:  Diagnosis Date  . Acne   . Allergy    seasonal allergies  . Concussion   . Seasonal allergies     Past Surgical History:  Procedure Laterality Date  . NASAL SEPTOPLASTY W/ TURBINOPLASTY Bilateral 05/05/2016   Procedure: NASAL SEPTOPLASTY WITH TURBINATE REDUCTION;  Surgeon: Rozetta Nunnery, MD;  Location: [facility];  Service: ENT;  Laterality: Bilateral;    There were no vitals filed for this visit.  Subjective Assessment - 12/07/18 1153    Subjective   I get a little distracted when talking while focusing on a task, but that's something I would have trouble with before my injury.     Pertinent History  syncopal episode, skull fx, [facility] 10/28/2018    Patient Stated Goals  to return to baseline, drive, ride a bike, exercise    Currently in Pain?  No/denies          Discussed with pt return to driving start of session as pt reports he received verbal clearance from MD to do so. Recommend pt obtain written clearance prior to return to driving for increased liability purposes if they were ever needed. Also educated pt on activity progression with return to driving including starting  initially with driving in familiar environment and on roads which may be less busy (vs interstate). Pt verbalizing understanding.    Session continuation of addressing higher level cognition function including alternating and divided attention seated in therapy gym. Pt continued with 3 tasks activity from previous session - he was able to recall instruction of each tabletop task and also recall the number of words missing from his alphabetized list from previous session. Pt instructed to proceed with task of his choosing initially and instructed to switch between each throughout. Increased pace of switching between tasks this session to increase cognitive challenge, also provided added challenge of having pt engage in conversation and answer questions unrelated to tabletop task at hand. Pt able to do so but verbalizes min challenge in doing so. Pt also requiring increased time periodically for completing math challenges and with 1 incorrect answer. Transitioned to sorting food items into categories during task continuation as well. Pt demonstrates good use of strategies to recall where he left off with each task, to double check his answers and to self correct PRN.   Discussion held with pt re: pt progress and at good point to hold further therapies unless pt sees continued challenges during daily activities at home, pt in agreement.  OT Education - 12/07/18 1341    Education Details  educated pt on importance of having medical clearance before return to driving and recommend pt obtain written clearance from MD to reinforce clearance to drive, pt verbalizing understanding     Person(s) Educated  Patient    Methods  Explanation    Comprehension  Verbalized understanding          OT Long Term Goals - 12/07/18 1637      OT LONG TERM GOAL #1   Title  Pt will demonstrate ability to perform functional activities requiring divided attention with 95% or better accuracy in  prep for driving and retun to school.    Time  5    Period  Weeks    Status  Achieved      OT LONG TERM GOAL #2   Title  I with HEP for cognitive activities.    Time  5    Period  Weeks    Status  Achieved      OT LONG TERM GOAL #3   Title  Pt / family will verbalize understanding of cognitive compensation strategies prn.    Status  Achieved      OT LONG TERM GOAL #4   Title  Pt/ family will verbalize understanding of recommendations regarding retrun to school and driving.    Time  5    Period  Weeks    Status  Achieved            Plan - 12/07/18 1343    Clinical Impression Statement  pt continues to make progress towards goals and with improvements in alternating and divided attention tasks at an increased pace today compared to previous sessions    Occupational Profile and client history currently impacting functional performance  PMH: syncopal episode, skull fx, cerebral contusion, SAH, pt to f/u with PCP re: thyroid nodule. Pt is a full time student at [employer], He was scheduled to participate in an intership this semester. Pt is unable to participate in internship / school activities at this time due to his deficits.     Occupational performance deficits (Please refer to evaluation for details):  ADL's;Leisure;IADL's;Play;Work;Education;Rest and Sleep;Social Participation    Rehab Potential  Good    OT Frequency  1x / week    OT Treatment/Interventions  Self-care/ADL training;Visual/perceptual remediation/compensation;Patient/family education;Therapeutic activities;Cognitive remediation/compensation;Balance training    Plan  will place pt on hold to allow pt to resume more activities at home;  pt to contact clinic should any issues arise. Pt was cleared by MD to drive.  Pt in agreement.     Clinical Decision Making  Limited treatment options, no task modification necessary    Consulted and Agree with Plan of Care  Patient       Patient will benefit from skilled  therapeutic intervention in order to improve the following deficits and impairments:  Decreased cognition, Impaired vision/preception, Decreased activity tolerance, Decreased endurance, Impaired perceived functional ability, Decreased safety awareness, Decreased knowledge of precautions  Visit Diagnosis: Frontal lobe and executive function deficit  Attention and concentration deficit    Problem List Patient Active Problem List   Diagnosis Date Noted  . SAH (subarachnoid hemorrhage) ([facility])   . Leukocytosis   . Cerebral contusion and laceration with loss of consciousness ([facility]) 10/28/2018    Quay Burow, OTR/L 12/07/2018, 4:40 PM  [facility] [address] [place], [place], [address] Phone: [phone]   Fax:  [phone]  Name: LEELYNN WHETSEL MRN: [id number] Date of Birth: November 04, 1995

## 2018-12-08 ENCOUNTER — Ambulatory Visit
Admission: RE | Admit: 2018-12-08 | Discharge: 2018-12-08 | Disposition: A | Discharge status: Home / Self Care | Payer: 59 | Source: Ambulatory Visit | Attending: Family Medicine | Admitting: Family Medicine

## 2018-12-08 DIAGNOSIS — E041 Nontoxic single thyroid nodule: Secondary | ICD-10-CM

## 2018-12-08 DIAGNOSIS — E042 Nontoxic multinodular goiter: Secondary | ICD-10-CM | POA: Diagnosis not present

## 2018-12-09 ENCOUNTER — Ambulatory Visit (INDEPENDENT_AMBULATORY_CARE_PROVIDER_SITE_OTHER): Payer: 59

## 2018-12-09 ENCOUNTER — Encounter: Payer: Self-pay | Admitting: Internal Medicine

## 2018-12-09 VITALS — BP 98/62 | HR 65 | Ht 72.0 in | Wt 146.0 lb

## 2018-12-09 DIAGNOSIS — R55 Syncope and collapse: Secondary | ICD-10-CM

## 2018-12-09 NOTE — Progress Notes (Signed)
1.) Reason for visit: Nurse Visit for Orthostatic Vitals  2.) Name of MD requesting visit: Dr. Dorris Carnes  3.) H&P: Vasovagal Syncope  4.) Assessment and plan per MD: Patient denies having any lightheadedness, dizziness, syncope, pre-syncope, palpitations, or any other Sx. Orthostatic vitals completed and reviewed with Dr. Harrington Challenger. Per Dr. Harrington Challenger, patient will f/u PRN. Per Dr. Harrington Challenger, the patient can drive and she will write a letter.

## 2018-12-09 NOTE — Progress Notes (Signed)
Patient feeling better   No dizziness  Increasing activity HR increases with stadning but BP remains relatively unchanged  Recomm:    I encouraged him to keep pushing fluids and salt Stay active Avoid situations like shots unless he takes extra precauttions (someone with him to keep eye in case he gets dizzy/vagal  Consider some desensitization  I feel in normal conditions he is a low risk for syncope   Spell was probably hypervagal response in response to shots   In setting of possible very mild autonomic dysfunction  Will be available to see as needed if develops further symptoms

## 2018-12-12 ENCOUNTER — Other Ambulatory Visit: Payer: Self-pay | Admitting: Family Medicine

## 2018-12-12 DIAGNOSIS — E041 Nontoxic single thyroid nodule: Secondary | ICD-10-CM

## 2018-12-13 ENCOUNTER — Ambulatory Visit
Admission: RE | Admit: 2018-12-13 | Discharge: 2018-12-13 | Disposition: A | Discharge status: Home / Self Care | Payer: 59 | Source: Ambulatory Visit | Attending: Family Medicine | Admitting: Family Medicine

## 2018-12-13 ENCOUNTER — Other Ambulatory Visit (HOSPITAL_COMMUNITY)
Admission: RE | Admit: 2018-12-13 | Discharge: 2018-12-13 | Disposition: A | Discharge status: Home / Self Care | Payer: 59 | Source: Ambulatory Visit | Attending: Physician Assistant | Admitting: Physician Assistant

## 2018-12-13 DIAGNOSIS — E041 Nontoxic single thyroid nodule: Secondary | ICD-10-CM | POA: Insufficient documentation

## 2018-12-13 DIAGNOSIS — E0789 Other specified disorders of thyroid: Secondary | ICD-10-CM | POA: Diagnosis not present

## 2018-12-13 NOTE — Procedures (Signed)
PROCEDURE SUMMARY:  Using direct ultrasound guidance, 4 passes were made using 25 g needles into the nodule within the left lobe of the thyroid.   Ultrasound was used to confirm needle placements on all occasions.   EBL = trace  Specimens were sent to Pathology for analysis.  See procedure note under Imaging tab in Epic for full procedure details.  Jacorion Klem S Kollen Armenti PA-C 12/13/2018 10:33 AM

## 2019-05-10 ENCOUNTER — Encounter: Payer: Self-pay | Admitting: Psychology

## 2019-07-13 DIAGNOSIS — D3131 Benign neoplasm of right choroid: Secondary | ICD-10-CM | POA: Diagnosis not present

## 2019-07-13 DIAGNOSIS — H52223 Regular astigmatism, bilateral: Secondary | ICD-10-CM | POA: Diagnosis not present

## 2019-07-13 DIAGNOSIS — H5213 Myopia, bilateral: Secondary | ICD-10-CM | POA: Diagnosis not present

## 2019-08-07 ENCOUNTER — Encounter

## 2019-08-07 ENCOUNTER — Ambulatory Visit: Payer: 59 | Admitting: Psychology

## 2019-11-29 ENCOUNTER — Encounter: Payer: Self-pay | Admitting: Occupational Therapy

## 2019-11-29 DIAGNOSIS — R41840 Attention and concentration deficit: Secondary | ICD-10-CM

## 2019-11-29 DIAGNOSIS — R41844 Frontal lobe and executive function deficit: Secondary | ICD-10-CM

## 2019-11-29 NOTE — Therapy (Signed)
[facility] [address] [place], [place], [address] Phone: [phone]   Fax:  [phone]  Occupational Therapy Treatment  Patient Details  Name: Patrick Mclaughlin MRN: [id number] Date of Birth: [birth year] No data recorded  Encounter Date: 11/29/2019    Past Medical History:  Diagnosis Date  . Acne   . Allergy    seasonal allergies  . Concussion   . Seasonal allergies     Past Surgical History:  Procedure Laterality Date  . NASAL SEPTOPLASTY W/ TURBINOPLASTY Bilateral 05/05/2016   Procedure: NASAL SEPTOPLASTY WITH TURBINATE REDUCTION;  Surgeon: Rozetta Nunnery, MD;  Location: [facility];  Service: ENT;  Laterality: Bilateral;    There were no vitals filed for this visit.                          OT Short Term Goals - 11/11/18 1124      OT SHORT TERM GOAL #1   Title  --    Time  --    Period  --    Status  --    Target Date  --      OT SHORT TERM GOAL #2   Title  --    Time  --    Period  --    Status  --        OT Long Term Goals - 11/29/19 1230      OT LONG TERM GOAL #1   Title  Pt will demonstrate ability to perform functional activities requiring divided attention with 95% or better accuracy in prep for driving and retun to school.    Time  5    Period  Weeks    Status  Achieved      OT LONG TERM GOAL #2   Title  I with HEP for cognitive activities.    Time  5    Period  Weeks    Status  Achieved      OT LONG TERM GOAL #3   Title  Pt / family will verbalize understanding of cognitive compensation strategies prn.    Status  Achieved      OT LONG TERM GOAL #4   Title  Pt/ family will verbalize understanding of recommendations regarding retrun to school and driving.    Time  5    Period  Weeks    Status  Achieved            Plan - 11/29/19 1230    Clinical Impression Statement  Pt was placed on hold in order to allow pt opportunity to  return should any other issues arise. Pt did not return to therapy therefore will d/c from [facility] and client history currently impacting functional performance  PMH: syncopal episode, skull fx, cerebral contusion, SAH, pt to f/u with PCP re: thyroid nodule. Pt is a full time student at [employer], He was scheduled to participate in an intership this semester. Pt is unable to participate in internship / school activities at this time due to his deficits.     Occupational performance deficits (Please refer to evaluation for details):  ADL's;Leisure;IADL's;Play;Work;Education;Rest and Sleep;Social Participation    Rehab Potential  Good    OT Frequency  1x / week    OT Duration  Other (comment)   5 weeks   OT Treatment/Interventions  Self-care/ADL training;Visual/perceptual remediation/compensation;Patient/family education;Therapeutic activities;Cognitive remediation/compensation;Balance training    Plan  d/c from OT    Consulted and Agree with Plan of Care  Patient       Patient will benefit from skilled therapeutic intervention in order to improve the following deficits and impairments:           Visit Diagnosis: Frontal lobe and executive function deficit  Attention and concentration deficit    Problem List Patient Active Problem List   Diagnosis Date Noted  . SAH (subarachnoid hemorrhage) ([facility])   . Leukocytosis   . Cerebral contusion and laceration with loss of consciousness ([facility]) 10/28/2018   OCCUPATIONAL THERAPY DISCHARGE SUMMARY  Visits from Start of Care: 5  Current functional level related to goals / functional outcomes: See above   Remaining deficits: No remaining deficits   Education / Equipment: HEP  Plan: Patient agrees to discharge.  Patient goals were met. Patient is being discharged due to meeting the stated rehab goals.  ?????      [place], Karen Halliday, OTR/L 11/29/2019, 12:32 PM  [facility] [address] [place], [place], [address] Phone: [phone]   Fax:  [phone]  Name: Rigoberto M Herard MRN: [id number] Date of Birth: 07/13/1996 

## 2020-10-04 IMAGING — CT CT HEAD W/O CM
4 series · 16 of 47 positions shown, 18 images · non-contrast
Comparison: 10/28/2018

CLINICAL DATA: Intracranial hemorrhage follow up

EXAM:
CT HEAD WITHOUT CONTRAST
TECHNIQUE: Contiguous axial images were obtained from the base of the skull
through the vertex without intravenous contrast.

[Series 3: head without · axial · non-contrast · 0.46mm/px · z∈[-91,+34]mm · 7 of 35 slices shown, 9 images]
[im 5/35  brain]
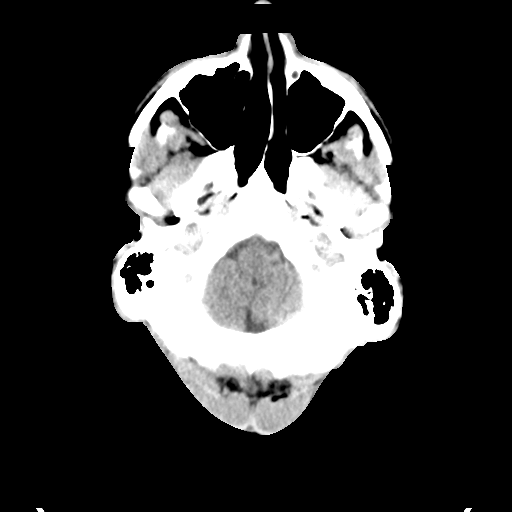
[im 5/35  bone]
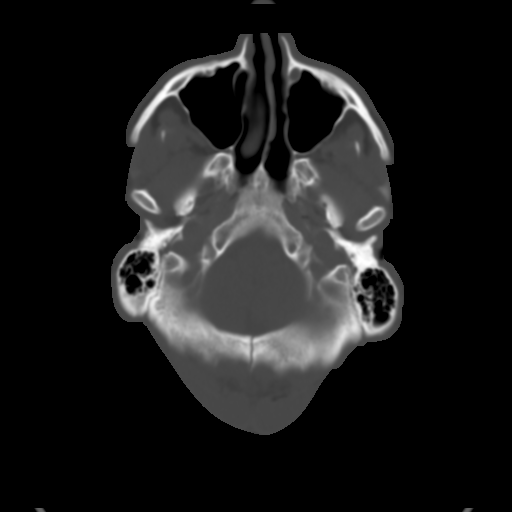
[im 9/35  brain]
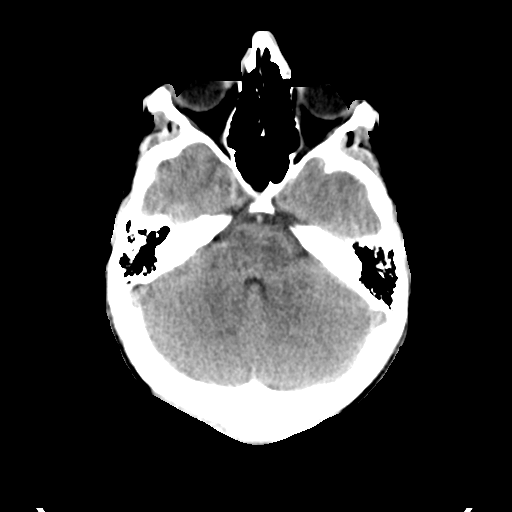
[im 13/35  brain]
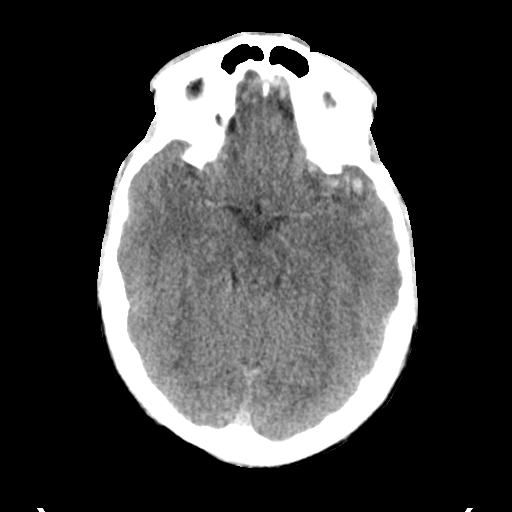
[im 18/35  brain]
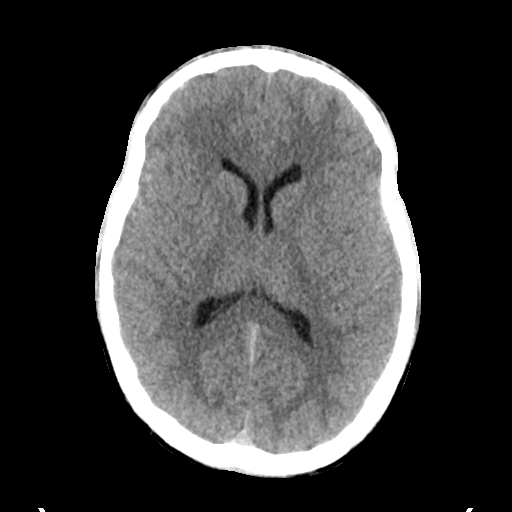
[im 22/35  brain]
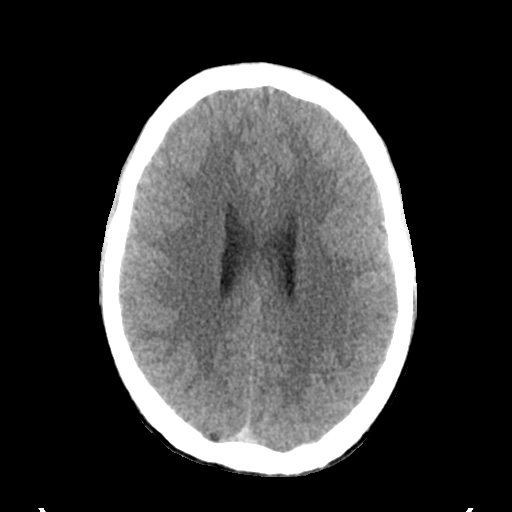
[im 22/35  bone]
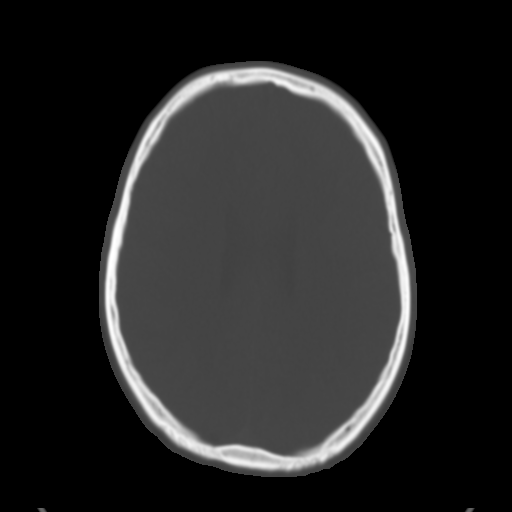
[im 26/35  brain]
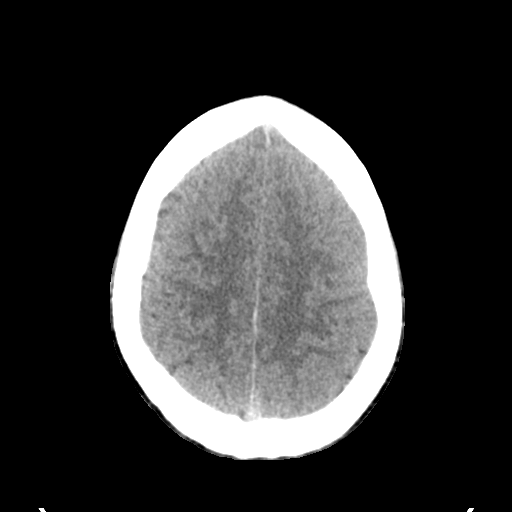
[im 30/35  brain]
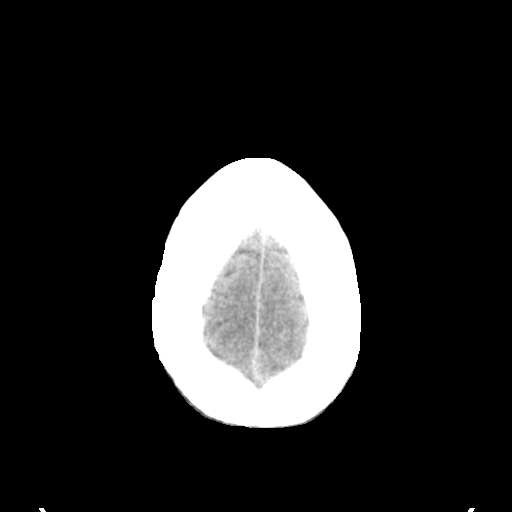

[Series 4: head bone · axial · 0.46mm/px · z∈[-95,-61]mm · 3 of 86 slices shown]
[im 9/86  bone]
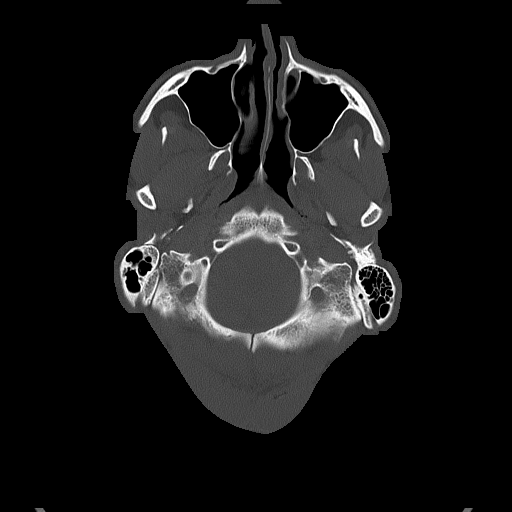
[im 18/86  bone]
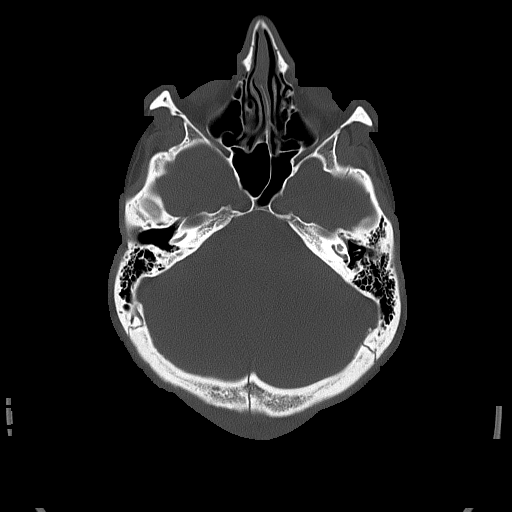
[im 26/86  bone]
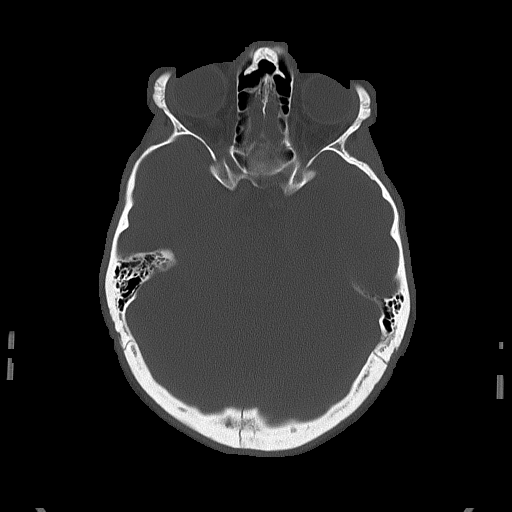

[Series 5: head without cor · coronal · non-contrast · 0.32mm/px · 3 of 72 slices shown]
[im 24/72  brain]
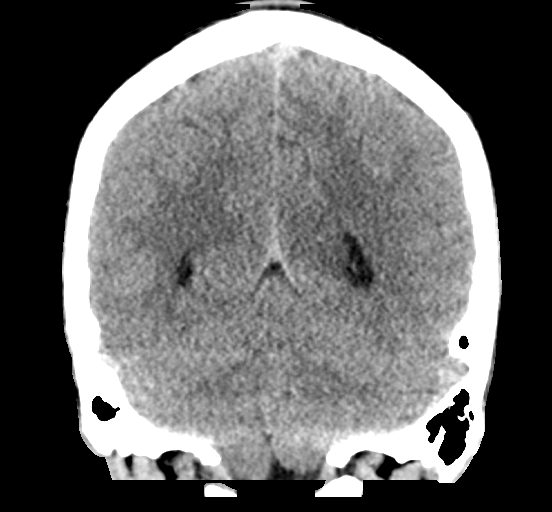
[im 32/72  brain]
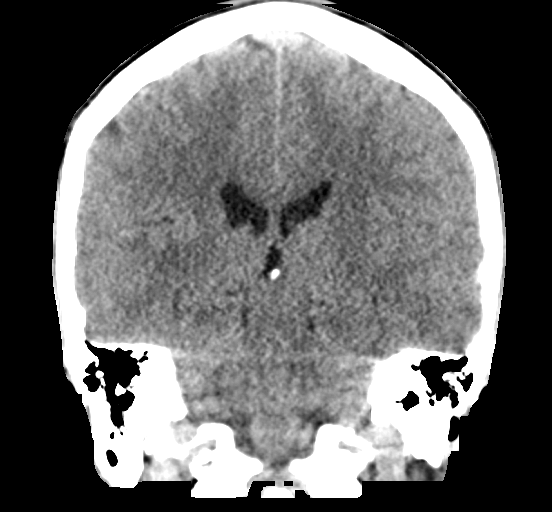
[im 40/72  brain]
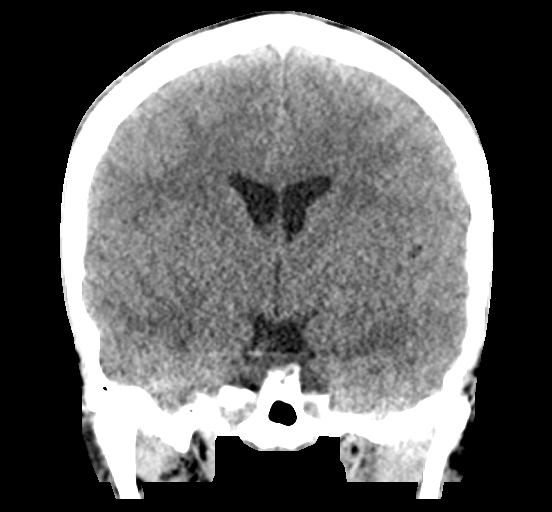

[Series 6: head without sag · sagittal · non-contrast · 0.33mm/px · 3 of 55 slices shown]
[im 19/55  brain]
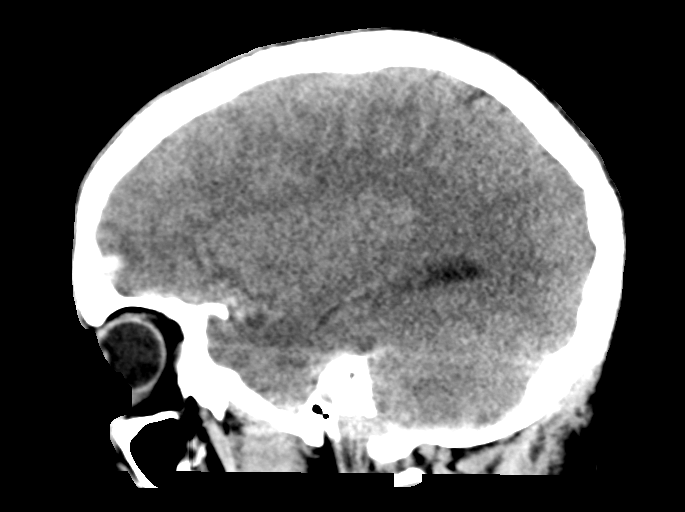
[im 28/55  brain]
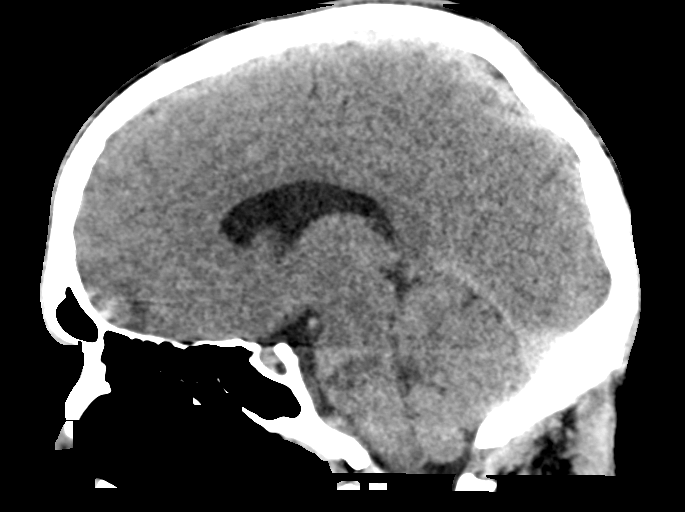
[im 37/55  brain]
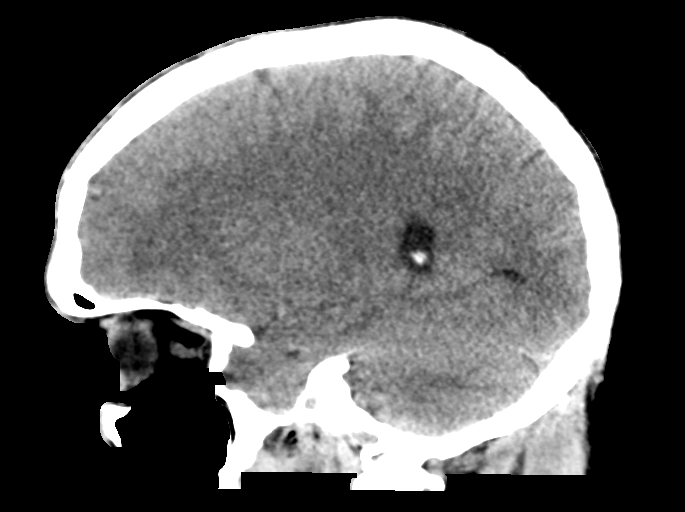

[16 of 47 positions shown; findings below may reference images not displayed]

FINDINGS: Brain: Small left frontal and anterior temporal lobe hemorrhagic
contusions. The temporal contusion has very slightly increased in
size (measuring approximately 6 mm), but the frontal contusion is
unchanged. Unchanged appearance of small volume subarachnoid blood.
No hydrocephalus. No midline shift or other mass effect. No new site
of hemorrhage.

Vascular: No abnormal hyperdensity of the major intracranial
arteries or dural venous sinuses. No intracranial atherosclerosis.

Skull: Unchanged appearance of sagittally oriented fracture of the
occipital bone.

Sinuses/Orbits: No fluid levels or advanced mucosal thickening of
the visualized paranasal sinuses. No mastoid or middle ear effusion.
The orbits are normal.
IMPRESSION: 1. Minimally increased size anterior left temporal lobe hemorrhagic
contusion with nearby subarachnoid blood
2. Unchanged small left frontal lobe hemorrhagic contusion with
associated subarachnoid blood.
3. Unchanged appearance of occipital bone fracture.

## 2021-12-17 DIAGNOSIS — Z113 Encounter for screening for infections with a predominantly sexual mode of transmission: Secondary | ICD-10-CM | POA: Diagnosis not present

## 2021-12-17 DIAGNOSIS — Z114 Encounter for screening for human immunodeficiency virus [HIV]: Secondary | ICD-10-CM | POA: Diagnosis not present

## 2021-12-18 DIAGNOSIS — Z113 Encounter for screening for infections with a predominantly sexual mode of transmission: Secondary | ICD-10-CM | POA: Diagnosis not present
